# Patient Record
Sex: Male | Born: 1975 | Race: White | Hispanic: No | Marital: Married | Smoking: Former smoker
Health system: Southern US, Community
[De-identification: ages and names within clinical notes are randomized; demographics above are authoritative.]

## PROBLEM LIST (undated history)

## (undated) HISTORY — PX: APPENDECTOMY: SHX54

---

## 2001-09-03 ENCOUNTER — Emergency Department (HOSPITAL_COMMUNITY): Admission: EM | Admit: 2001-09-03 | Discharge: 2001-09-03 | Payer: Self-pay | Admitting: Emergency Medicine

## 2001-09-03 ENCOUNTER — Encounter: Payer: Self-pay | Admitting: Emergency Medicine

## 2004-02-02 ENCOUNTER — Emergency Department (HOSPITAL_COMMUNITY): Admission: EM | Admit: 2004-02-02 | Discharge: 2004-02-03 | Payer: Self-pay | Admitting: *Deleted

## 2005-01-21 ENCOUNTER — Encounter: Admission: RE | Admit: 2005-01-21 | Discharge: 2005-01-21 | Payer: Self-pay | Admitting: Occupational Medicine

## 2007-01-31 ENCOUNTER — Ambulatory Visit (HOSPITAL_COMMUNITY): Admission: RE | Admit: 2007-01-31 | Discharge: 2007-01-31 | Payer: Self-pay | Admitting: Family Medicine

## 2010-10-11 ENCOUNTER — Other Ambulatory Visit (HOSPITAL_COMMUNITY): Payer: Self-pay | Admitting: Internal Medicine

## 2010-10-11 DIAGNOSIS — M25512 Pain in left shoulder: Secondary | ICD-10-CM

## 2010-10-13 ENCOUNTER — Ambulatory Visit (HOSPITAL_COMMUNITY)
Admission: RE | Admit: 2010-10-13 | Discharge: 2010-10-13 | Disposition: A | Discharge status: Home / Self Care | Payer: BC Managed Care – PPO | Source: Ambulatory Visit | Attending: Internal Medicine | Admitting: Internal Medicine

## 2010-10-13 DIAGNOSIS — M25512 Pain in left shoulder: Secondary | ICD-10-CM

## 2010-10-13 DIAGNOSIS — M19019 Primary osteoarthritis, unspecified shoulder: Secondary | ICD-10-CM | POA: Insufficient documentation

## 2010-10-13 DIAGNOSIS — M25519 Pain in unspecified shoulder: Secondary | ICD-10-CM | POA: Insufficient documentation

## 2012-01-29 ENCOUNTER — Emergency Department (HOSPITAL_COMMUNITY)
Admission: EM | Admit: 2012-01-29 | Discharge: 2012-01-29 | Disposition: A | Discharge status: Home / Self Care | Payer: BC Managed Care – PPO | Attending: Emergency Medicine | Admitting: Emergency Medicine

## 2012-01-29 ENCOUNTER — Encounter (HOSPITAL_COMMUNITY): Payer: Self-pay | Admitting: Emergency Medicine

## 2012-01-29 DIAGNOSIS — F17200 Nicotine dependence, unspecified, uncomplicated: Secondary | ICD-10-CM | POA: Insufficient documentation

## 2012-01-29 DIAGNOSIS — Z880 Allergy status to penicillin: Secondary | ICD-10-CM | POA: Insufficient documentation

## 2012-01-29 DIAGNOSIS — K047 Periapical abscess without sinus: Secondary | ICD-10-CM

## 2012-01-29 MED ORDER — HYDROCODONE-ACETAMINOPHEN 5-325 MG PO TABS
1.0000 | ORAL_TABLET | Freq: Once | ORAL | Status: DC
  Filled 2012-01-29: qty 1

## 2012-01-29 MED ORDER — CLINDAMYCIN HCL 150 MG PO CAPS: 300.0000 mg | ORAL_CAPSULE | Freq: Four times a day (QID) | ORAL | Status: AC

## 2012-01-29 MED ORDER — CLINDAMYCIN HCL 150 MG PO CAPS
300.0000 mg | ORAL_CAPSULE | Freq: Once | ORAL | Status: AC
  Administered 2012-01-29: 300 mg via ORAL
  Filled 2012-01-29: qty 2

## 2012-01-29 MED ORDER — IBUPROFEN 800 MG PO TABS: 800.0000 mg | ORAL_TABLET | Freq: Three times a day (TID) | ORAL | Status: AC

## 2012-01-29 MED ORDER — HYDROMORPHONE HCL PF 2 MG/ML IJ SOLN
2.0000 mg | Freq: Once | INTRAMUSCULAR | Status: AC
  Administered 2012-01-29: 2 mg via INTRAMUSCULAR
  Filled 2012-01-29: qty 1

## 2012-01-29 MED ORDER — OXYCODONE-ACETAMINOPHEN 5-325 MG PO TABS: 1.0000 | ORAL_TABLET | Freq: Four times a day (QID) | ORAL | Status: AC | PRN

## 2012-01-29 NOTE — ED Notes (Signed)
Pt c/o facial swelling and jaw pain since Friday. Pt has rt sided facial/lip/neck swelling.

## 2012-01-29 NOTE — ED Provider Notes (Signed)
History  This chart was scribed for Samuel Jakes, MD by Bennett Scrape. This patient was seen in room [facility] and the patient's care was started at 1:16PM.  CSN: [id number]  Arrival date & time 01/29/12  1226   First MD Initiated Contact with Patient 01/29/12 1316      Chief Complaint  Patient presents with  . Facial Swelling  . Dental Pain    Patient is a 36 y.o. male presenting with tooth pain. The history is provided by the patient. No language interpreter was used.  Dental PainThe primary symptoms include headaches. Primary symptoms do not include fever, shortness of breath or sore throat. The symptoms began 3 to 5 days ago. The symptoms are worsening. The symptoms are new. The symptoms occur constantly.  Additional symptoms include: facial swelling.    [name] is a 36 y.o. male who presents to the Emergency Department complaining of 3 days of gradual onset, gradually worsening, constant right-sided dental pain described as throbbing with associated facial swelling, right-sided lower jaw pain, right-sided neck pain and HA. He denies having pain with eating. He reports taking Ibuprofen with mild improvement. He denies fever, trouble swallowing, trouble breathing, nausea and emesis as associated symptoms. He does not have a h/o chronic medical conditions. He is a current everyday smoker and occasional alcohol user.   History reviewed. No pertinent past medical history.  Past Surgical History  Procedure Date  . Appendectomy     No family history on file.  History  Substance Use Topics  . Smoking status: Current Everyday Smoker    Types: Cigarettes  . Smokeless tobacco: Not on file  . Alcohol Use: Yes      Review of Systems  Constitutional: Negative for fever and chills.  HENT: Positive for facial swelling, neck pain and dental problem. Negative for sore throat.   Eyes: Negative for visual disturbance.  Respiratory: Negative for shortness of breath.     Cardiovascular: Negative for chest pain.  Gastrointestinal: Negative for nausea, vomiting, abdominal pain and diarrhea.  Genitourinary: Negative for dysuria.  Musculoskeletal: Negative for back pain.  Skin: Negative for rash.  Neurological: Positive for headaches.    Allergies  Amoxicillin and Penicillins  Home Medications   Current Outpatient Rx  Name Route Sig Dispense Refill  . CLINDAMYCIN HCL 150 MG PO CAPS Oral Take 2 capsules (300 mg total) by mouth every 6 (six) hours. 28 capsule 0  . IBUPROFEN 800 MG PO TABS Oral Take 1 tablet (800 mg total) by mouth 3 (three) times daily. 30 tablet 0  . OXYCODONE-ACETAMINOPHEN 5-325 MG PO TABS Oral Take 1-2 tablets by mouth every 6 (six) hours as needed for pain. 20 tablet 0    Triage Vitals: BP 137/87  Pulse 79  Temp 98.8 F (37.1 C) (Oral)  Resp 18  Ht 5\' 10"  (1.778 m)  Wt 210 lb (95.255 kg)  BMI 30.13 kg/m2  SpO2 98%  Physical Exam  Nursing note and vitals reviewed. Constitutional: He is oriented to person, place, and time. He appears well-developed and well-nourished. No distress.  HENT:  Head: Normocephalic and atraumatic.       3 cm right-sided facial swelling along the jaw line with induration, no buccal swelling, 2nd molar on the right side is avulsed with surrounding erythema and inflammation  Eyes: Conjunctivae and EOM are normal.  Neck: Neck supple. No tracheal deviation present.  Cardiovascular: Normal rate and regular rhythm.   No murmur heard. Pulmonary/Chest: Effort normal and  breath sounds normal. No respiratory distress.  Abdominal: Soft. Bowel sounds are normal. There is no tenderness.  Musculoskeletal: Normal range of motion. He exhibits no edema.  Lymphadenopathy:    He has cervical adenopathy (right-sided, no left sided adenopathy noted).  Neurological: He is alert and oriented to person, place, and time.  Skin: Skin is warm and dry.  Psychiatric: He has a normal mood and affect. His behavior is normal.     ED Course  Procedures (including critical care time)  DIAGNOSTIC STUDIES: Oxygen Saturation is 98% on room air, normal by my interpretation.    COORDINATION OF CARE: 1:40PM-Discussed treatment plan which includes Vicodin and Clindamycin with pt at bedside and pt agreed to plan. 1:50PM-Per nurse, pt is requesting a different pain medication, because he reports that he took Vicodin at home with no improvement. He also reported to the nurse that he took some of his wife's old antibiotic prescription. None of this was stated at my bedside interview with the pt. Will order Dilaudid instead. 2:12PM-Discussed discharge plan of motrin, clindamycin and percocet with pt and pt agreed to plan. Advised pt to follow up with a dentist and pt agreed.  Labs Reviewed - No data to display No results found.   1. Tooth abscess       MDM  Findings consistent with right-sided lower tooth molar tooth abscess no swelling to the floor the mouth. Patient is allergic to penicillin so he be treated with clindamycin first dose given in the emergency department and he also be treated with Percocet and Motrin patient does have a dentist that he will call Monday for followup. Patient knows to return for any swelling of the floor the mouth or pushing up of the tongue.      I personally performed the services described in this documentation, which was scribed in my presence. The recorded information has been reviewed and considered.     Samuel Jakes, MD 01/29/12 [phone]

## 2012-01-29 NOTE — ED Notes (Signed)
Iv removed from right forearm, cath intact, pt tolerated well,

## 2012-10-10 ENCOUNTER — Other Ambulatory Visit: Payer: Self-pay | Admitting: Occupational Medicine

## 2012-10-10 ENCOUNTER — Ambulatory Visit: Payer: Self-pay

## 2012-10-10 DIAGNOSIS — R52 Pain, unspecified: Secondary | ICD-10-CM

## 2015-05-05 ENCOUNTER — Other Ambulatory Visit (HOSPITAL_COMMUNITY): Payer: Self-pay | Admitting: Internal Medicine

## 2015-05-05 DIAGNOSIS — M5481 Occipital neuralgia: Secondary | ICD-10-CM

## 2015-05-06 ENCOUNTER — Emergency Department (HOSPITAL_COMMUNITY)
Admission: EM | Admit: 2015-05-06 | Discharge: 2015-05-06 | Disposition: A | Discharge status: Home / Self Care | Payer: 59 | Attending: Emergency Medicine | Admitting: Emergency Medicine

## 2015-05-06 ENCOUNTER — Emergency Department (HOSPITAL_COMMUNITY): Payer: 59

## 2015-05-06 DIAGNOSIS — F17210 Nicotine dependence, cigarettes, uncomplicated: Secondary | ICD-10-CM | POA: Diagnosis not present

## 2015-05-06 DIAGNOSIS — Z880 Allergy status to penicillin: Secondary | ICD-10-CM | POA: Insufficient documentation

## 2015-05-06 DIAGNOSIS — R519 Headache, unspecified: Secondary | ICD-10-CM

## 2015-05-06 DIAGNOSIS — R51 Headache: Secondary | ICD-10-CM

## 2015-05-06 DIAGNOSIS — M5481 Occipital neuralgia: Secondary | ICD-10-CM | POA: Insufficient documentation

## 2015-05-06 MED ORDER — BUPIVACAINE HCL (PF) 0.5 % IJ SOLN
INTRAMUSCULAR | Status: AC
  Filled 2015-05-06: qty 30

## 2015-05-06 MED ORDER — HYDROCODONE-ACETAMINOPHEN 5-325 MG PO TABS: 2.0000 | ORAL_TABLET | ORAL | Status: DC | PRN

## 2015-05-06 MED ORDER — METHOCARBAMOL 500 MG PO TABS: 500.0000 mg | ORAL_TABLET | Freq: Three times a day (TID) | ORAL | Status: DC | PRN

## 2015-05-06 MED ORDER — BUPIVACAINE HCL (PF) 0.5 % IJ SOLN
10.0000 mL | Freq: Once | INTRAMUSCULAR | Status: AC
  Administered 2015-05-06: 10 mL

## 2015-05-06 MED ORDER — PREDNISONE 20 MG PO TABS: 20.0000 mg | ORAL_TABLET | Freq: Two times a day (BID) | ORAL | Status: DC

## 2015-05-06 NOTE — ED Provider Notes (Signed)
CSN: [id number]     Arrival date & time 05/06/15  1802 History   First MD Initiated Contact with Patient 05/06/15 1815     Chief Complaint  Patient presents with  . Headache     HPI  Patient since for evaluation of a headache. Had a headache for the last 10 days. He awakened on a Sunday morning with pain "in my skull". He shows an area just inferior to the occipital ridge to the right superior aspect of the right neck. States it hurts to lean back or leaning back into the left. He does not feel pain in the anterior neck or left side of the neck. No radiation down the arm. No stroke symptoms numbness or weakness. No fall no injury or trauma.  His not have migraine symptoms. No nausea, no photophobia, no phonophobia, no fevers chills. No other abnormality's.  Seen by his primary care physician prescribed ibuprofen. Was given a shot of a pain medication and a dose of by mouth steroids. He states this helped for less than a few hours. This was 7 days ago.  No past medical history on file. Past Surgical History  Procedure Laterality Date  . Appendectomy     No family history on file. Social History  Substance Use Topics  . Smoking status: Current Every Day Smoker    Types: Cigarettes  . Smokeless tobacco: Not on file  . Alcohol Use: Yes    Review of Systems  Constitutional: Negative for fever, chills, diaphoresis, appetite change and fatigue.  HENT: Negative for mouth sores, sore throat and trouble swallowing.   Eyes: Negative for visual disturbance.  Respiratory: Negative for cough, chest tightness, shortness of breath and wheezing.   Cardiovascular: Negative for chest pain.  Gastrointestinal: Negative for nausea, vomiting, abdominal pain, diarrhea and abdominal distention.  Endocrine: Negative for polydipsia, polyphagia and polyuria.  Genitourinary: Negative for dysuria, frequency and hematuria.  Musculoskeletal: Negative for gait problem.  Skin: Negative for color change,  pallor and rash.  Neurological: Positive for headaches. Negative for dizziness, syncope and light-headedness.  Hematological: Does not bruise/bleed easily.  Psychiatric/Behavioral: Negative for behavioral problems and confusion.      Allergies  Amoxicillin and Penicillins  Home Medications   Prior to Admission medications   Medication Sig Start Date End Date Taking? Authorizing Provider  HYDROcodone-acetaminophen (NORCO/VICODIN) 5-325 MG tablet Take 2 tablets by mouth every 4 (four) hours as needed. 05/06/15   Rolland PorterMark Kaiea Esselman, MD  methocarbamol (ROBAXIN) 500 MG tablet Take 1 tablet (500 mg total) by mouth 3 (three) times daily between meals as needed. 05/06/15   Rolland PorterMark Bessye Stith, MD  predniSONE (DELTASONE) 20 MG tablet Take 1 tablet (20 mg total) by mouth 2 (two) times daily with a meal. 05/06/15   Rolland PorterMark Braelynne Garinger, MD   BP 123/87 mmHg  Pulse 62  Temp(Src) 97.9 F (36.6 C) (Oral)  Resp 20  Ht 5\' 10"  (1.778 m)  Wt 230 lb (104.327 kg)  BMI 33.00 kg/m2  SpO2 99% Physical Exam  Constitutional: He is oriented to person, place, and time. He appears well-developed and well-nourished. No distress.  HENT:  Head: Normocephalic.    Point tender in the direct suboccipital region and the right posterior to the mastoid.  Increase with movement of the neck into extension or rotation. No meningismus.  Eyes: Conjunctivae are normal. Pupils are equal, round, and reactive to light. No scleral icterus.  Neck: Normal range of motion. Neck supple. No thyromegaly present.  Cardiovascular: Normal rate and  regular rhythm.  Exam reveals no gallop and no friction rub.   No murmur heard. Pulmonary/Chest: Effort normal and breath sounds normal. No respiratory distress. He has no wheezes. He has no rales.  Abdominal: Soft. Bowel sounds are normal. He exhibits no distension. There is no tenderness. There is no rebound.  Musculoskeletal: Normal range of motion.  Neurological: He is alert and oriented to person, place, and  time.  Skin: Skin is warm and dry. No rash noted.  Psychiatric: He has a normal mood and affect. His behavior is normal.    ED Course  Procedures (including critical care time) Labs Review Labs Reviewed - No data to display  Imaging Review Ct Head Wo Contrast  05/06/2015  CLINICAL DATA:  Patient c/o pain to RT side of post head at base of skull since 04/26/15; patient states he has not had any kind of injury. EXAM: CT HEAD WITHOUT CONTRAST CT CERVICAL SPINE WITHOUT CONTRAST TECHNIQUE: Multidetector CT imaging of the head and cervical spine was performed following the standard protocol without intravenous contrast. Multiplanar CT image reconstructions of the cervical spine were also generated. COMPARISON:  None. FINDINGS: CT HEAD FINDINGS There is no evidence of acute intracranial hemorrhage, brain edema, mass lesion, acute infarction, mass effect, or midline shift. Acute infarct may be inapparent on noncontrast CT. No other intra-axial abnormalities are seen, and the ventricles and sulci are within normal limits in size and symmetry. No abnormal extra-axial fluid collections or masses are identified. No significant calvarial abnormality. CT CERVICAL SPINE FINDINGS Normal alignment. Vertebral body and disc height maintained throughout. No prevertebral soft tissue swelling. Facets are seated. Negative for fracture. No significant osseous degenerative change. Lung apices clear. IMPRESSION: 1. Negative for bleed or other acute intracranial process. 2. Negative cervical spine. Electronically Signed   By: Corlis Leak M.D.   On: 05/06/2015 19:17   Ct Cervical Spine Wo Contrast  05/06/2015  CLINICAL DATA:  Patient c/o pain to RT side of post head at base of skull since 04/26/15; patient states he has not had any kind of injury. EXAM: CT HEAD WITHOUT CONTRAST CT CERVICAL SPINE WITHOUT CONTRAST TECHNIQUE: Multidetector CT imaging of the head and cervical spine was performed following the standard protocol  without intravenous contrast. Multiplanar CT image reconstructions of the cervical spine were also generated. COMPARISON:  None. FINDINGS: CT HEAD FINDINGS There is no evidence of acute intracranial hemorrhage, brain edema, mass lesion, acute infarction, mass effect, or midline shift. Acute infarct may be inapparent on noncontrast CT. No other intra-axial abnormalities are seen, and the ventricles and sulci are within normal limits in size and symmetry. No abnormal extra-axial fluid collections or masses are identified. No significant calvarial abnormality. CT CERVICAL SPINE FINDINGS Normal alignment. Vertebral body and disc height maintained throughout. No prevertebral soft tissue swelling. Facets are seated. Negative for fracture. No significant osseous degenerative change. Lung apices clear. IMPRESSION: 1. Negative for bleed or other acute intracranial process. 2. Negative cervical spine. Electronically Signed   By: Corlis Leak M.D.   On: 05/06/2015 19:17   I have personally reviewed and evaluated these images and lab results as part of my medical decision-making.   EKG Interpretation None      MDM   Final diagnoses:  Acute nonintractable headache, unspecified headache type  Occipital neuralgia of right side    Patient given suboccipital nerve block. As performed by me. After Betadine prep, 5 mL of 0.5% Marcaine injected along the occipital ridge to the right  suboccipital nerve. This was without complication.  CT scans were obtained of his head and his neck showed no acute abnormality. On recheck he has complete resolution of his pain. I think his symptoms were likely secondary to suboccipital muscle pain, or occipital neuralgia. Discharge with pain medication, muscle action, anti-inflammatory. Also prednisone. Asked to follow up with his primary care physician.        Rolland Porter, MD 05/06/15 [phone]

## 2015-05-06 NOTE — ED Notes (Signed)
Pain is to right side of base of skull.  Pt is having issues with getting approved for CT vs MRI per pt.

## 2015-05-06 NOTE — ED Notes (Signed)
Pt states understanding of care given and follow up instructions 

## 2015-05-06 NOTE — Discharge Instructions (Signed)
Occipital Neuralgia Occipital neuralgia is a type of headache that causes episodes of very bad pain in the back of your head. Pain from occipital neuralgia may spread (radiate) to other parts of your head. The pain is usually brief and often goes away after you rest and relax. These headaches may be caused by irritation of the nerves that leave your spinal cord high up in your neck, just below the base of your skull (occipital nerves). Your occipital nerves transmit sensations from the back of your head, the top of your head, and the areas behind your ears. CAUSES Occipital neuralgia can occur without any known cause (primary headache syndrome). In other cases, occipital neuralgia is caused by pressure on or irritation of one of the two occipital nerves. Causes of occipital nerve compression or irritation include:  Wear and tear of the vertebrae in the neck (osteoarthritis).  Neck injury.  Disease of the disks that separate the vertebrae.  Tumors.  Gout.  Infections.  Diabetes.  Swollen blood vessels that put pressure on the occipital nerves.  Muscle spasm in the neck. SIGNS AND SYMPTOMS Pain is the main symptom of occipital neuralgia. It usually starts in the back of the head but may also be felt in other areas supplied by the occipital nerves. Pain is usually on one side but may be on both sides. You may have:   Brief episodes of very bad pain that is burning, stabbing, shocking, or shooting.  Pain behind the eye.  Pain triggered by neck movement or hair brushing.  Scalp tenderness.  Aching in the back of the head between episodes of very bad pain. DIAGNOSIS  Your health care provider may diagnose occipital neuralgia based on your symptoms and a physical exam. During the exam, the health care provider may push on areas supplied by the occipital nerves to see if they are painful. Some tests may also be done to help in making the diagnosis. These may include:  Imaging studies of  the upper spinal cord, such as an MRI or CT scan. These may show compression or spinal cord abnormalities.  Nerve block. You will get an injection of numbing medicine (local anesthetic) near the occipital nerve to see if this relieves pain. TREATMENT  Treatment may begin with simple measures, such as:   Rest.  Massage.  Heat.  Over-the-counter pain relievers. If these measures do not work, you may need other treatments, including:  Medicines such as:  Prescription-strength anti-inflammatory medicines.  Muscle relaxants.  Antiseizure medicines.  Antidepressants.  Steroid injection. This involves injections of local anesthetic and strong anti-inflammatory drugs (steroids).  Pulsed radiofrequency. Wires are implanted to deliver electrical impulses that block pain signals from the occipital nerve.  Physical therapy.  Surgery to relieve nerve pressure. HOME CARE INSTRUCTIONS  Take all medicines as directed by your health care provider.  Avoid activities that cause pain.  Rest when you have an attack of pain.  Try gentle massage or a heating pad to relieve pain.  Work with a physical therapist to learn stretching exercises you can do at home.  Try a different pillow or sleeping position.  Practice good posture.  Try to stay active. Get regular exercise that does not cause pain. Ask your health care provider to suggest safe exercises for you.  Keep all follow-up visits as directed by your health care provider. This is important. SEEK MEDICAL CARE IF:  Your medicine is not working.  You have new or worsening symptoms. SEEK IMMEDIATE MEDICAL CARE   IF:  You have very bad head pain that is not going away.  You have a sudden change in vision, balance, or speech. MAKE SURE YOU:  Understand these instructions.  Will watch your condition.  Will get help right away if you are not doing well or get worse.   This information is not intended to replace advice given to  you by your health care provider. Make sure you discuss any questions you have with your health care provider.   Document Released: 05/31/2001 Document Revised: 06/27/2014 Document Reviewed: 05/29/2013 Elsevier Interactive Patient Education 2016 [employer]  

## 2015-05-06 NOTE — ED Notes (Signed)
Having headache since 04/26/15, seen PCP on Wed and treated.  Rates pain 8/10.

## 2016-09-22 IMAGING — CT CT CERVICAL SPINE W/O CM
3 of 6 series · 11 of 33 positions shown, 13 images · non-contrast
Comparison: None.

CLINICAL DATA: Patient c/o pain to RT side of post head at base of
skull since 04/26/15; patient states he has not had any kind of
injury.

EXAM:
CT HEAD WITHOUT CONTRAST
CT CERVICAL SPINE WITHOUT CONTRAST
TECHNIQUE: Multidetector CT imaging of the head and cervical spine was
performed following the standard protocol without intravenous
contrast. Multiplanar CT image reconstructions of the cervical spine
were also generated.

[Series 7: sagittal bone 2.0 · sagittal · 0.21mm/px · 5 of 43 slices shown, 6 images]
[im 15/43  bone]
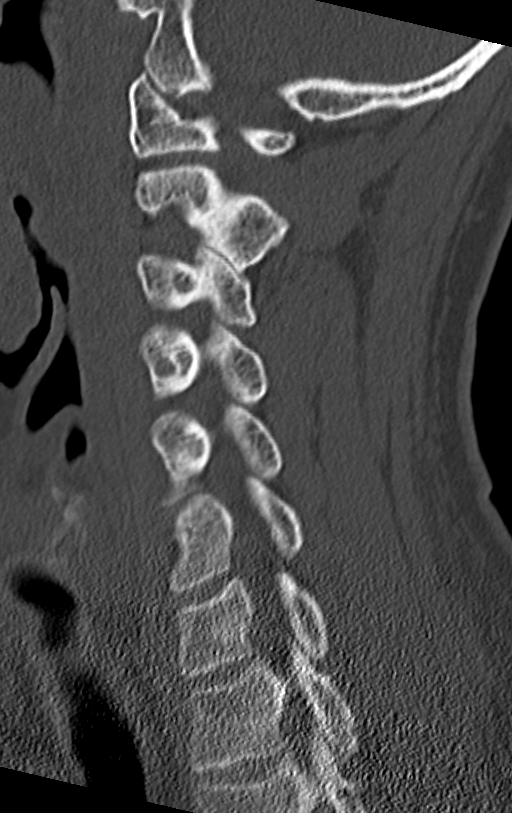
[im 18/43  bone]
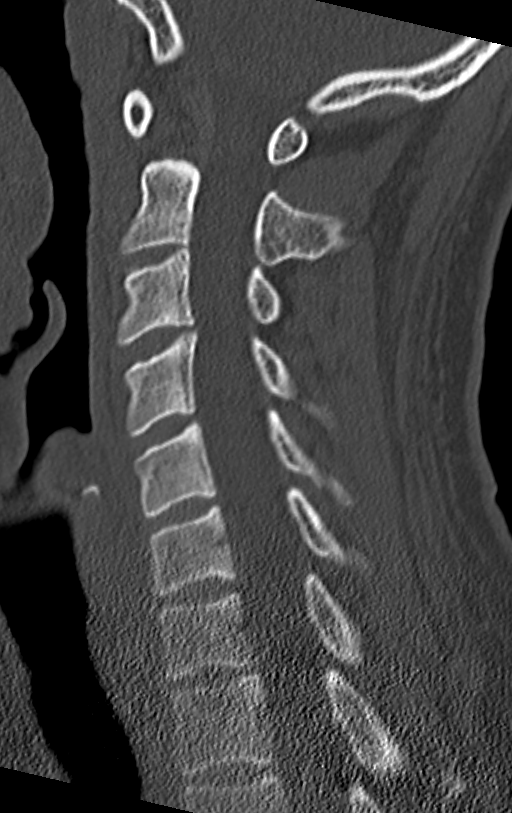
[im 22/43  soft-tissue]
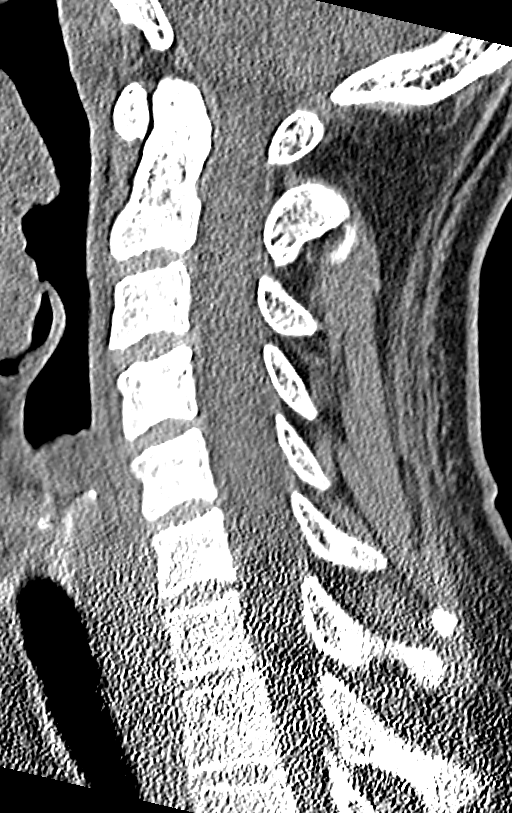
[im 22/43  bone]
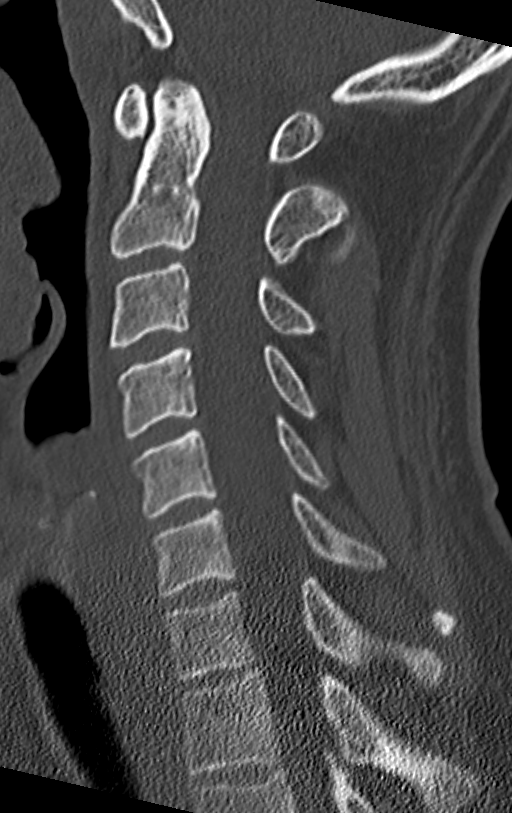
[im 25/43  bone]
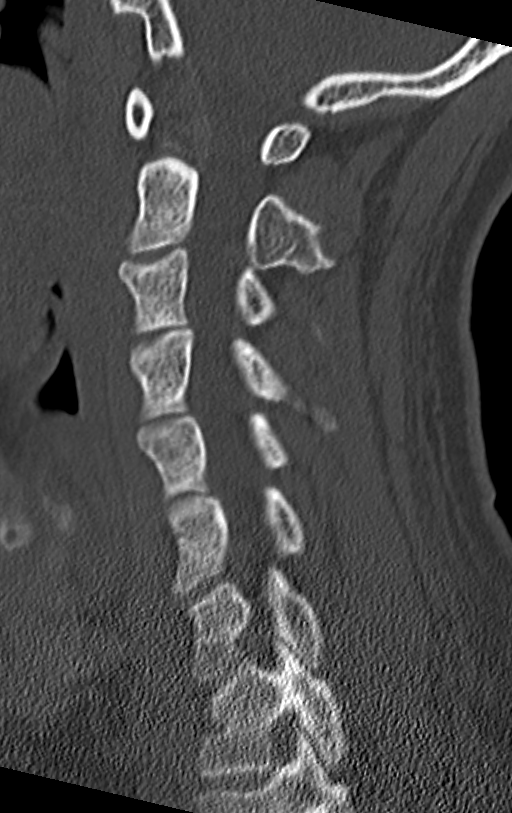
[im 29/43  bone]
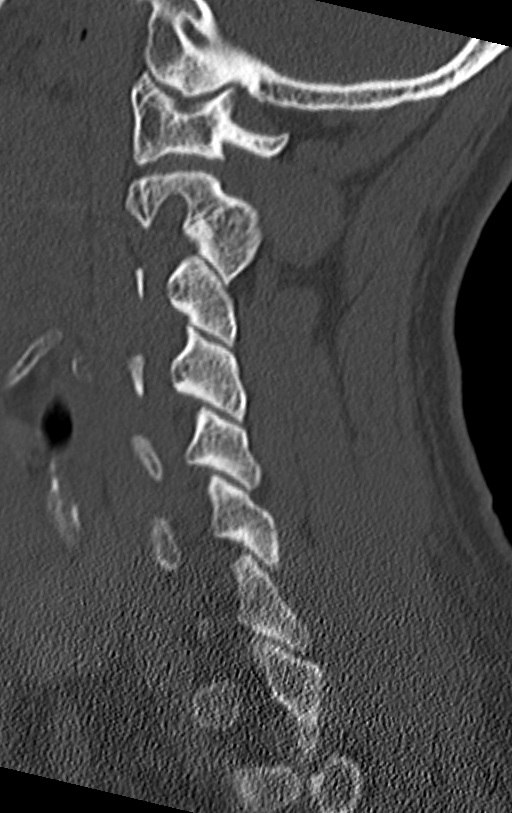

[Series 8: coronal bone 2.0 · coronal · 0.20mm/px · 3 of 53 slices shown]
[im 11/53  bone]
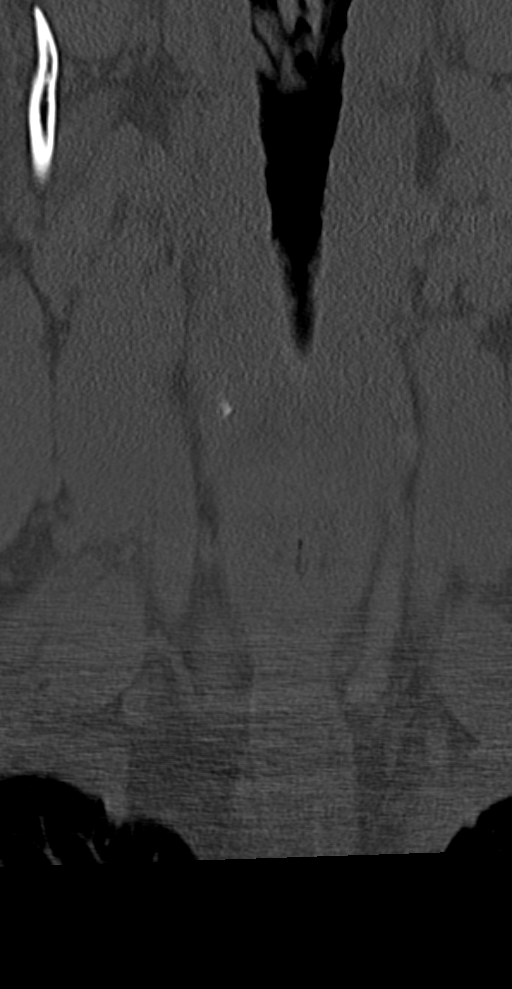
[im 21/53  bone]
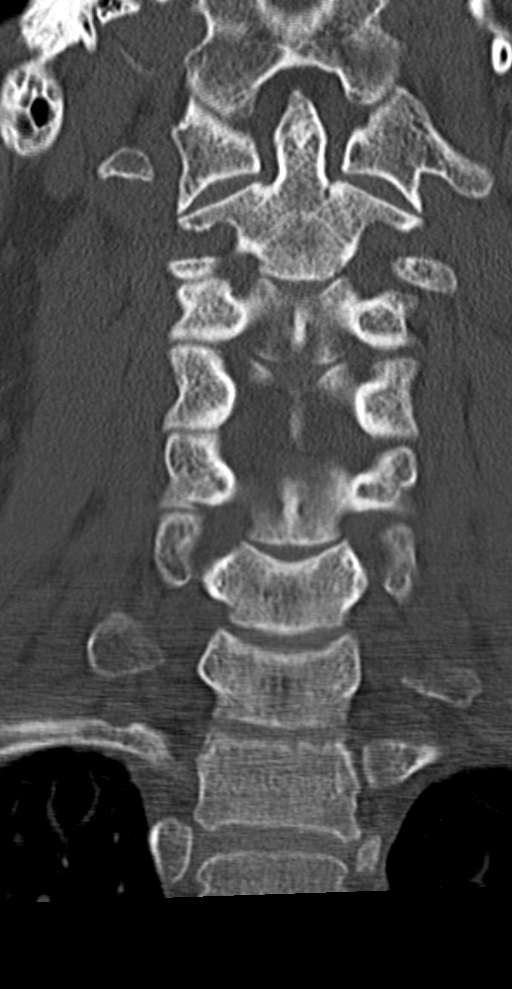
[im 32/53  bone]
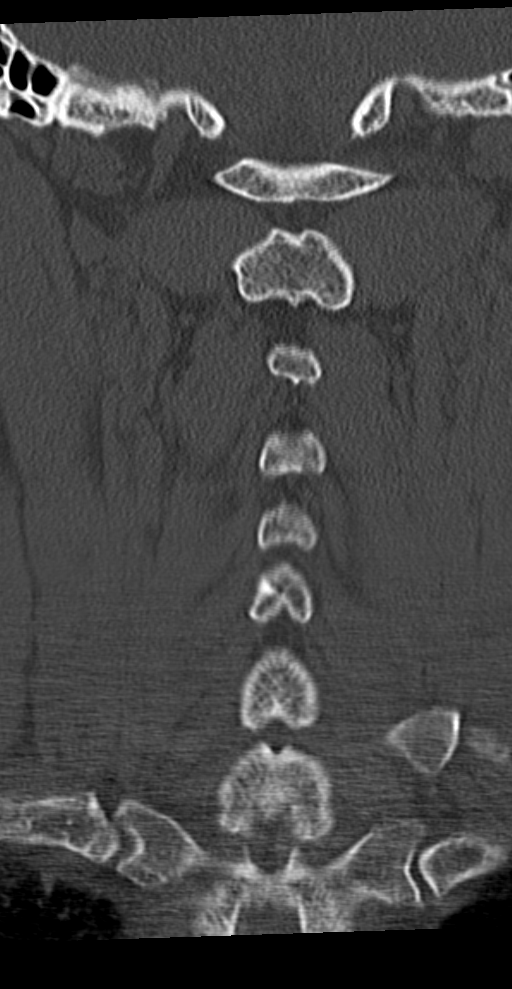

[Series 9: axial bone 2.0 · axial · 0.21mm/px · z∈[+104,+190]mm · 3 of 92 slices shown, 4 images]
[im 23/92  soft-tissue]
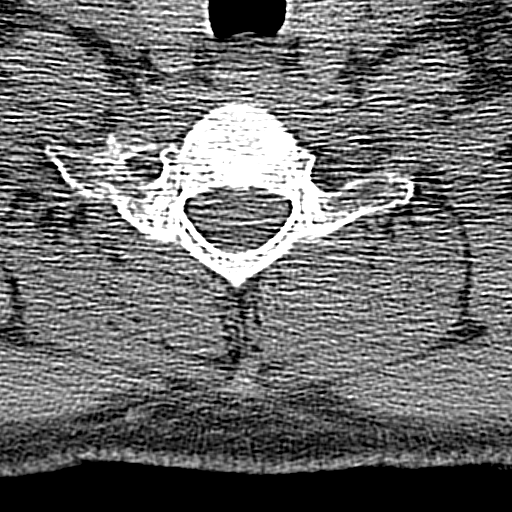
[im 23/92  bone]
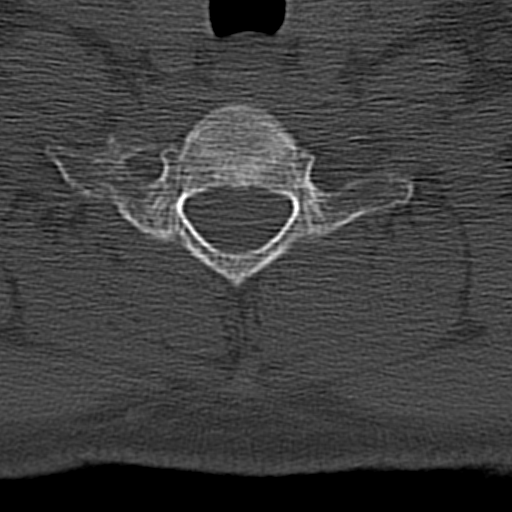
[im 46/92  bone]
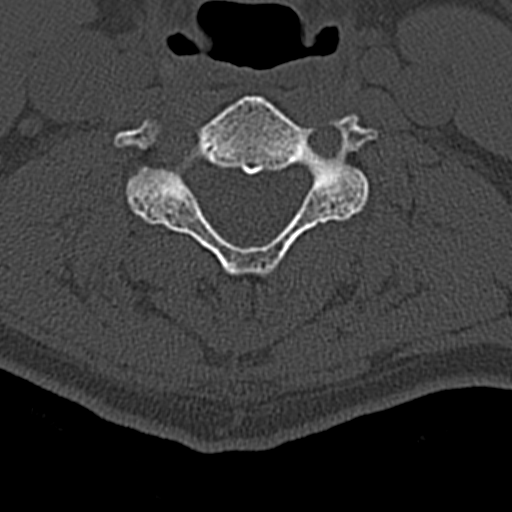
[im 69/92  bone]
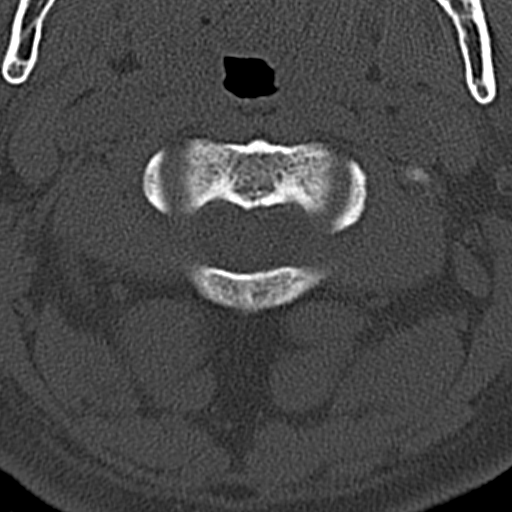

[11 of 33 positions shown; findings below may reference images not displayed]

FINDINGS: CT HEAD FINDINGS

There is no evidence of acute intracranial hemorrhage, brain edema,
mass lesion, acute infarction, mass effect, or midline shift. Acute
infarct may be inapparent on noncontrast CT. No other intra-axial
abnormalities are seen, and the ventricles and sulci are within
normal limits in size and symmetry. No abnormal extra-axial fluid
collections or masses are identified. No significant calvarial
abnormality.

CT CERVICAL SPINE FINDINGS

Normal alignment. Vertebral body and disc height maintained
throughout. No prevertebral soft tissue swelling. Facets are seated.
Negative for fracture. No significant osseous degenerative change.
Lung apices clear.
IMPRESSION: 1. Negative for bleed or other acute intracranial process.
2. Negative cervical spine.

## 2017-05-23 ENCOUNTER — Emergency Department (HOSPITAL_COMMUNITY): Payer: 59

## 2017-05-23 ENCOUNTER — Other Ambulatory Visit: Payer: Self-pay

## 2017-05-23 ENCOUNTER — Encounter (HOSPITAL_COMMUNITY): Payer: Self-pay | Admitting: *Deleted

## 2017-05-23 ENCOUNTER — Emergency Department (HOSPITAL_COMMUNITY)
Admission: EM | Admit: 2017-05-23 | Discharge: 2017-05-23 | Disposition: A | Discharge status: Home / Self Care | Payer: 59 | Attending: Emergency Medicine | Admitting: Emergency Medicine

## 2017-05-23 DIAGNOSIS — R0789 Other chest pain: Secondary | ICD-10-CM | POA: Diagnosis not present

## 2017-05-23 DIAGNOSIS — R079 Chest pain, unspecified: Secondary | ICD-10-CM | POA: Diagnosis present

## 2017-05-23 DIAGNOSIS — F17210 Nicotine dependence, cigarettes, uncomplicated: Secondary | ICD-10-CM | POA: Diagnosis not present

## 2017-05-23 DIAGNOSIS — Z79899 Other long term (current) drug therapy: Secondary | ICD-10-CM | POA: Diagnosis not present

## 2017-05-23 LAB — BASIC METABOLIC PANEL
ANION GAP: 8 (ref 5–15)
BUN: 10 mg/dL (ref 6–20)
CALCIUM: 9.7 mg/dL (ref 8.9–10.3)
CO2: 26 mmol/L (ref 22–32)
CREATININE: 0.86 mg/dL (ref 0.61–1.24)
Chloride: 103 mmol/L (ref 101–111)
GLUCOSE: 98 mg/dL (ref 65–99)
Potassium: 3.9 mmol/L (ref 3.5–5.1)
Sodium: 137 mmol/L (ref 135–145)

## 2017-05-23 LAB — CBC WITH DIFFERENTIAL/PLATELET
BASOS ABS: 0 10*3/uL (ref 0.0–0.1)
BASOS PCT: 0 %
EOS ABS: 0.2 10*3/uL (ref 0.0–0.7)
EOS PCT: 1 %
HEMATOCRIT: 43.7 % (ref 39.0–52.0)
HEMOGLOBIN: 14.5 g/dL (ref 13.0–17.0)
LYMPHS ABS: 2.1 10*3/uL (ref 0.7–4.0)
Lymphocytes Relative: 16 %
MCH: 29.5 pg (ref 26.0–34.0)
MCHC: 33.2 g/dL (ref 30.0–36.0)
MCV: 89 fL (ref 78.0–100.0)
MONOS PCT: 12 %
Monocytes Absolute: 1.5 10*3/uL — ABNORMAL HIGH (ref 0.1–1.0)
NEUTROS PCT: 71 %
Neutro Abs: 9.3 10*3/uL — ABNORMAL HIGH (ref 1.7–7.7)
Platelets: 226 10*3/uL (ref 150–400)
RBC: 4.91 MIL/uL (ref 4.22–5.81)
RDW: 12.9 % (ref 11.5–15.5)
WBC: 13.1 10*3/uL — AB (ref 4.0–10.5)

## 2017-05-23 LAB — D-DIMER, QUANTITATIVE: D-Dimer, Quant: <0.27 ug/mL-FEU (ref 0.00–0.50)

## 2017-05-23 LAB — TROPONIN I: Troponin I: <0.03 ng/mL (ref <0.03)

## 2017-05-23 MED ORDER — MORPHINE SULFATE (PF) 4 MG/ML IV SOLN: 4.0000 mg | INTRAVENOUS | Status: DC | PRN

## 2017-05-23 MED ORDER — NAPROXEN 250 MG PO TABS: 250.0000 mg | ORAL_TABLET | Freq: Two times a day (BID) | ORAL | 0 refills | Status: DC | PRN

## 2017-05-23 MED ORDER — METHOCARBAMOL 500 MG PO TABS: 1000.0000 mg | ORAL_TABLET | Freq: Four times a day (QID) | ORAL | 0 refills | Status: DC | PRN

## 2017-05-23 NOTE — ED Provider Notes (Signed)
[facility] Provider Note   CSN: [id number] Arrival date & time: 05/23/17  1801     History   Chief Complaint Chief Complaint  Patient presents with  . Chest Pain    HPI Samuel Daniels is a 41 y.o. male.  HPI  Pt was seen at 1850. Per pt, c/o gradual onset and persistence of constant chest "pain" for the past 2 days. Describes the CP as "constant" and "sharp" and "tight."  Pt states the CP started Sunday afternoon after he played golf in the morning. Pt did not have CP while playing golf. Pt states the pain started in his left chest, now is located in his left and right chest. Denies palpitations, no SOB/cough, no abd pain, no N/V/D, no back pain, no neck pain, no fevers, no rash.    History reviewed. No pertinent past medical history.  There are no active problems to display for this patient.   Past Surgical History:  Procedure Laterality Date  . APPENDECTOMY         Home Medications    Prior to Admission medications   Medication Sig Start Date End Date Taking? Authorizing Provider  HYDROcodone-acetaminophen (NORCO/VICODIN) 5-325 MG tablet Take 2 tablets by mouth every 4 (four) hours as needed. 05/06/15   Rolland PorterJames, Mark, MD  methocarbamol (ROBAXIN) 500 MG tablet Take 1 tablet (500 mg total) by mouth 3 (three) times daily between meals as needed. 05/06/15   Rolland PorterJames, Mark, MD  predniSONE (DELTASONE) 20 MG tablet Take 1 tablet (20 mg total) by mouth 2 (two) times daily with a meal. 05/06/15   Rolland PorterJames, Mark, MD    Family History Denies early CAD hx in family. No family history on file.    Social History Social History   Tobacco Use  . Smoking status: Current Every Day Smoker    Types: Cigarettes  . Smokeless tobacco: Never Used  Substance Use Topics  . Alcohol use: Yes  . Drug use: No     Allergies   Amoxicillin and Penicillins   Review of Systems Review of Systems ROS: Statement: All systems negative except as marked or noted in the  HPI; Constitutional: Negative for fever and chills. ; ; Eyes: Negative for eye pain, redness and discharge. ; ; ENMT: Negative for ear pain, hoarseness, nasal congestion, sinus pressure and sore throat. ; ; Cardiovascular: +CP. Negative for palpitations, diaphoresis, dyspnea and peripheral edema. ; ; Respiratory: Negative for cough, wheezing and stridor. ; ; Gastrointestinal: Negative for nausea, vomiting, diarrhea, abdominal pain, blood in stool, hematemesis, jaundice and rectal bleeding. . ; ; Genitourinary: Negative for dysuria, flank pain and hematuria. ; ; Musculoskeletal: Negative for back pain and neck pain. Negative for swelling and trauma.; ; Skin: Negative for pruritus, rash, abrasions, blisters, bruising and skin lesion.; ; Neuro: Negative for headache, lightheadedness and neck stiffness. Negative for weakness, altered level of consciousness, altered mental status, extremity weakness, paresthesias, involuntary movement, seizure and syncope.       Physical Exam Updated Vital Signs BP 135/87   Pulse 84   Resp 18   Ht 5\' 11"  (1.803 m)   Wt 101.6 kg (224 lb)   SpO2 98%   BMI 31.24 kg/m   Physical Exam 1855: Physical examination:  Nursing notes reviewed; Vital signs and O2 SAT reviewed;  Constitutional: Well developed, Well nourished, Well hydrated, In no acute distress; Head:  Normocephalic, atraumatic; Eyes: EOMI, PERRL, No scleral icterus; ENMT: Mouth and pharynx normal, Mucous membranes moist; Neck: Supple,  Full range of motion, No lymphadenopathy; Cardiovascular: Regular rate and rhythm, No gallop; Respiratory: Breath sounds clear & equal bilaterally, No wheezes.  Speaking full sentences with ease, Normal respiratory effort/excursion; Chest: Nontender, Movement normal; Abdomen: Soft, Nontender, Nondistended, Normal bowel sounds; Genitourinary: No CVA tenderness; Extremities: Pulses normal, No tenderness, No edema, No calf edema or asymmetry.; Neuro: AA&Ox3, Major CN grossly intact.   Speech clear. No gross focal motor or sensory deficits in extremities.; Skin: Color normal, Warm, Dry.   ED Treatments / Results  Labs (all labs ordered are listed, but only abnormal results are displayed)   EKG  EKG Interpretation  Date/Time:  Tuesday May 23 2017 18:18:42 EST Ventricular Rate:  80 PR Interval:  184 QRS Duration: 84 QT Interval:  352 QTC Calculation: 405 R Axis:   14 Text Interpretation:  Normal sinus rhythm Nonspecific T wave abnormality No old tracing to compare Confirmed by Samuel Jester [phone]) on 05/23/2017 6:57:47 PM       Radiology   Procedures Procedures (including critical care time)  Medications Ordered in ED Medications  morphine 4 MG/ML injection 4 mg (not administered)     Initial Impression / Assessment and Plan / ED Course  I have reviewed the triage vital signs and the nursing notes.  Pertinent labs & imaging results that were available during my care of the patient were reviewed by me and considered in my medical decision making (see chart for details).  MDM Reviewed: previous chart, nursing note and vitals Reviewed previous: labs Interpretation: labs, ECG and x-ray   Results for orders placed or performed during the hospital encounter of 05/23/17  Basic metabolic panel  Result Value Ref Range   Sodium 137 135 - 145 mmol/L   Potassium 3.9 3.5 - 5.1 mmol/L   Chloride 103 101 - 111 mmol/L   CO2 26 22 - 32 mmol/L   Glucose, Bld 98 65 - 99 mg/dL   BUN 10 6 - 20 mg/dL   Creatinine, Ser 6.04 0.61 - 1.24 mg/dL   Calcium 9.7 8.9 - 54.0 mg/dL   GFR calc non Af Amer >60 >60 mL/min   GFR calc Af Amer >60 >60 mL/min   Anion gap 8 5 - 15  Troponin I  Result Value Ref Range   Troponin I <0.03 <0.03 ng/mL  CBC with Differential  Result Value Ref Range   WBC 13.1 (H) 4.0 - 10.5 K/uL   RBC 4.91 4.22 - 5.81 MIL/uL   Hemoglobin 14.5 13.0 - 17.0 g/dL   HCT 98.1 19.1 - 47.8 %   MCV 89.0 78.0 - 100.0 fL   MCH 29.5 26.0 - 34.0  pg   MCHC 33.2 30.0 - 36.0 g/dL   RDW 29.5 62.1 - 30.8 %   Platelets 226 150 - 400 K/uL   Neutrophils Relative % 71 %   Neutro Abs 9.3 (H) 1.7 - 7.7 K/uL   Lymphocytes Relative 16 %   Lymphs Abs 2.1 0.7 - 4.0 K/uL   Monocytes Relative 12 %   Monocytes Absolute 1.5 (H) 0.1 - 1.0 K/uL   Eosinophils Relative 1 %   Eosinophils Absolute 0.2 0.0 - 0.7 K/uL   Basophils Relative 0 %   Basophils Absolute 0.0 0.0 - 0.1 K/uL  D-dimer, quantitative  Result Value Ref Range   D-Dimer, Quant <0.27 0.00 - 0.50 ug/mL-FEU   Dg Chest 2 View Result Date: 05/23/2017 CLINICAL DATA:  Left-sided chest pain for the past 2 days. EXAM: CHEST  2 VIEW COMPARISON:  Chest x-ray dated January 21, 2005. FINDINGS: The heart size and mediastinal contours are within normal limits. Both lungs are clear. The visualized skeletal structures are unremarkable. IMPRESSION: No active cardiopulmonary disease. Electronically Signed   By: Obie DredgeWilliam T Derry M.D.   On: 05/23/2017 20:33    2055:  Doubt PE as cause for symptoms with normal d-dimer and low risk [place].  Doubt ACS as cause for symptoms with normal troponin and non-specific EKG after 2 days of constant symptoms. Also reassured that pt did not have CP while playing golf. Tx symptomatically at this time. Dx and testing d/w pt and family.  Questions answered.  Verb understanding, agreeable to d/c home with outpt f/u.    Final Clinical Impressions(s) / ED Diagnoses   Final diagnoses:  None    ED Discharge Orders    None       Samuel JesterMcManus, Steffani Dionisio, DO 05/26/17 [id number]

## 2017-05-23 NOTE — ED Triage Notes (Signed)
Chest pain left side for 2 days

## 2017-05-23 NOTE — Discharge Instructions (Signed)
Take the prescriptions as directed.  Apply moist heat or ice to the area(s) of discomfort, for 15 minutes at a time, several times per day for the next few days.  Do not fall asleep on a heating or ice pack.  Call your regular medical doctor tomorrow to schedule a follow up appointment in the next 2 days.  Return to the Emergency Department immediately if worsening. ° °

## 2018-02-13 DIAGNOSIS — M25532 Pain in left wrist: Secondary | ICD-10-CM | POA: Diagnosis not present

## 2018-02-13 DIAGNOSIS — G9009 Other idiopathic peripheral autonomic neuropathy: Secondary | ICD-10-CM | POA: Diagnosis not present

## 2018-02-13 DIAGNOSIS — Z6833 Body mass index (BMI) 33.0-33.9, adult: Secondary | ICD-10-CM | POA: Diagnosis not present

## 2018-05-30 ENCOUNTER — Emergency Department (HOSPITAL_COMMUNITY)
Admission: EM | Admit: 2018-05-30 | Discharge: 2018-05-30 | Disposition: A | Discharge status: Home / Self Care | Payer: 59 | Attending: Emergency Medicine | Admitting: Emergency Medicine

## 2018-05-30 ENCOUNTER — Other Ambulatory Visit: Payer: Self-pay

## 2018-05-30 ENCOUNTER — Encounter (HOSPITAL_COMMUNITY): Payer: Self-pay | Admitting: Emergency Medicine

## 2018-05-30 DIAGNOSIS — Z87891 Personal history of nicotine dependence: Secondary | ICD-10-CM | POA: Insufficient documentation

## 2018-05-30 DIAGNOSIS — L0231 Cutaneous abscess of buttock: Secondary | ICD-10-CM | POA: Insufficient documentation

## 2018-05-30 MED ORDER — IBUPROFEN 800 MG PO TABS
800.0000 mg | ORAL_TABLET | Freq: Once | ORAL | Status: AC
  Administered 2018-05-30: 800 mg via ORAL
  Filled 2018-05-30: qty 1

## 2018-05-30 MED ORDER — SULFAMETHOXAZOLE-TRIMETHOPRIM 800-160 MG PO TABS: 1.0000 | ORAL_TABLET | Freq: Two times a day (BID) | ORAL | 0 refills | Status: AC

## 2018-05-30 MED ORDER — LIDOCAINE HCL (PF) 2 % IJ SOLN
INTRAMUSCULAR | Status: AC
  Administered 2018-05-30: 23:00:00
  Filled 2018-05-30: qty 20

## 2018-05-30 MED ORDER — SULFAMETHOXAZOLE-TRIMETHOPRIM 800-160 MG PO TABS
1.0000 | ORAL_TABLET | Freq: Once | ORAL | Status: AC
  Administered 2018-05-30: 1 via ORAL
  Filled 2018-05-30: qty 1

## 2018-05-30 NOTE — ED Provider Notes (Signed)
[facility]   CSN: [id number] Arrival date & time: 05/30/18  2204     History   Chief Complaint Chief Complaint  Patient presents with  . Abscess    HPI Samuel Daniels is a 42 y.o. male.   Abscess  Size:  Buttock Abscess quality: draining, induration, painful, redness and warmth   Red streaking: no   Duration:  3 days Progression:  Worsening Pain details:    Quality:  Pressure and aching   Severity:  Moderate   Duration:  3 days   Timing:  Constant   Progression:  Worsening Chronicity:  New Context: not diabetes and not immunosuppression   Relieved by:  Nothing Worsened by:  Draining/squeezing Ineffective treatments:  NSAIDs and draining/squeezing Associated symptoms: no anorexia, no fatigue, no fever, no headaches, no nausea and no vomiting   Risk factors: no hx of MRSA and no prior abscess     History reviewed. No pertinent past medical history.  There are no active problems to display for this patient.   Past Surgical History:  Procedure Laterality Date  . APPENDECTOMY          Home Medications    Prior to Admission medications   Medication Sig Start Date End Date Taking? Authorizing Provider  sulfamethoxazole-trimethoprim (BACTRIM DS,SEPTRA DS) 800-160 MG tablet Take 1 tablet by mouth 2 (two) times daily for 5 days. 05/30/18 06/04/18  Garnette Gunnerhompson, Eva Griffo B, MD    Family History No family history on file.  Social History Social History   Tobacco Use  . Smoking status: Former Smoker    Types: Cigarettes  . Smokeless tobacco: Never Used  Substance Use Topics  . Alcohol use: Yes  . Drug use: No     Allergies   Amoxicillin and Penicillins   Review of Systems Review of Systems  Constitutional: Negative for fatigue and fever.  Gastrointestinal: Negative for anorexia, nausea and vomiting.  Neurological: Negative for headaches.  All other systems reviewed and are negative.    Physical Exam Updated  Vital Signs BP (!) 141/78   Pulse 78   Temp 97.9 F (36.6 C)   Resp 20   Ht 5\' 10"  (1.778 m)   Wt 104.3 kg   SpO2 100%   BMI 33.00 kg/m   General well-appearing, no distressGeneral: Buttocks: Indurated, abscess, on medial inferior gluteal cheek, no streaking, tender to palpation, 2 inches in diameter   ED Treatments / Results  Labs (all labs ordered are listed, but only abnormal results are displayed) Labs Reviewed - No data to display  EKG None  Radiology No results found.  Procedures .Marland Kitchen.Incision and Drainage Date/Time: 05/30/2018 11:06 PM Performed by: Garnette Gunnerhompson, Jaqualin Serpa B, MD Authorized by: Jacalyn LefevreHaviland, Julie, MD   Consent:    Consent obtained:  Verbal   Consent given by:  Patient   Risks discussed:  Bleeding, incomplete drainage, pain and infection   Alternatives discussed:  No treatment Location:    Type:  Abscess   Location:  Anogenital   Anogenital location:  Gluteal cleft Pre-procedure details:    Skin preparation:  Betadine Anesthesia (see MAR for exact dosages):    Anesthesia method:  Local infiltration   Local anesthetic:  Lidocaine 2% w/o epi Procedure type:    Complexity:  Simple Procedure details:    Incision types:  Stab incision   Scalpel blade:  11   Wound management:  Irrigated with saline   Drainage:  Bloody and purulent   Drainage amount:  Moderate   Wound treatment:  Wound left open   Packing materials:  None Post-procedure details:    Patient tolerance of procedure:  Tolerated well, no immediate complications   (including critical care time)  Medications Ordered in ED Medications  lidocaine (XYLOCAINE) 2 % injection (has no administration in time range)  ibuprofen (ADVIL,MOTRIN) tablet 800 mg (has no administration in time range)  sulfamethoxazole-trimethoprim (BACTRIM DS,SEPTRA DS) 800-160 MG per tablet 1 tablet (has no administration in time range)     Initial Impression / Assessment and Plan / ED Course  I have reviewed the  triage vital signs and the nursing notes.  Pertinent labs & imaging results that were available during my care of the patient were reviewed by me and considered in my medical decision making (see chart for details).     Patient presenting with abscess partially draining.  No signs or symptoms of systemic infection.  I&D performed at bedside.  Sent home with 5-day course of Bactrim.  Return precautions given.  Final Clinical Impressions(s) / ED Diagnoses   Final diagnoses:  Abscess of buttock, left    ED Discharge Orders         Ordered    sulfamethoxazole-trimethoprim (BACTRIM DS,SEPTRA DS) 800-160 MG tablet  2 times daily     05/30/18 2255           Garnette Gunner, MD 05/30/18 2308    Jacalyn Lefevre, MD 05/30/18 2330

## 2018-05-30 NOTE — ED Triage Notes (Signed)
Pt c/o abscess to the buttocks for a few days.

## 2018-10-10 IMAGING — DX DG CHEST 2V
2 series · 2 of 2 positions shown · non-contrast
Comparison: Chest x-ray dated January 21, 2005.

CLINICAL DATA: Left-sided chest pain for the past 2 days.

EXAM:
CHEST  2 VIEW

[chest pa]
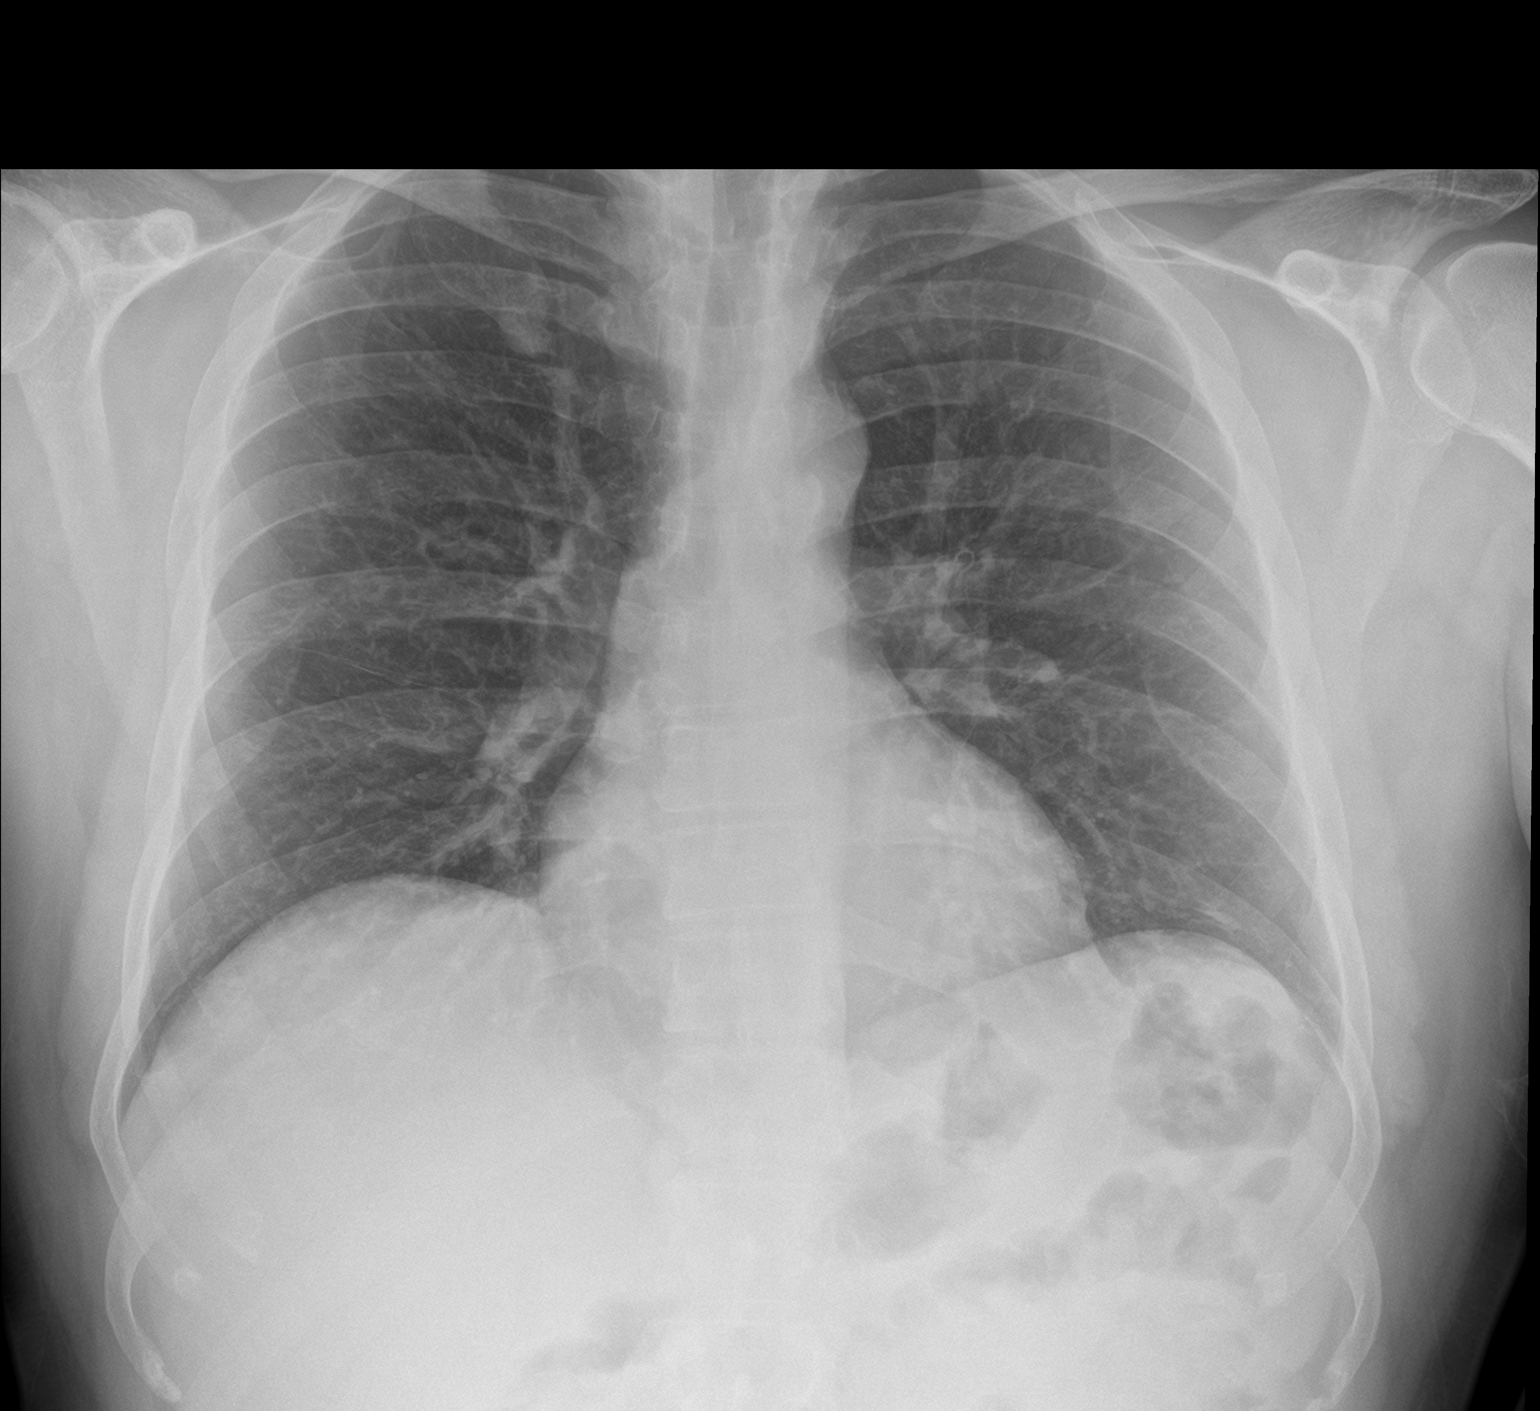

[chest lat]
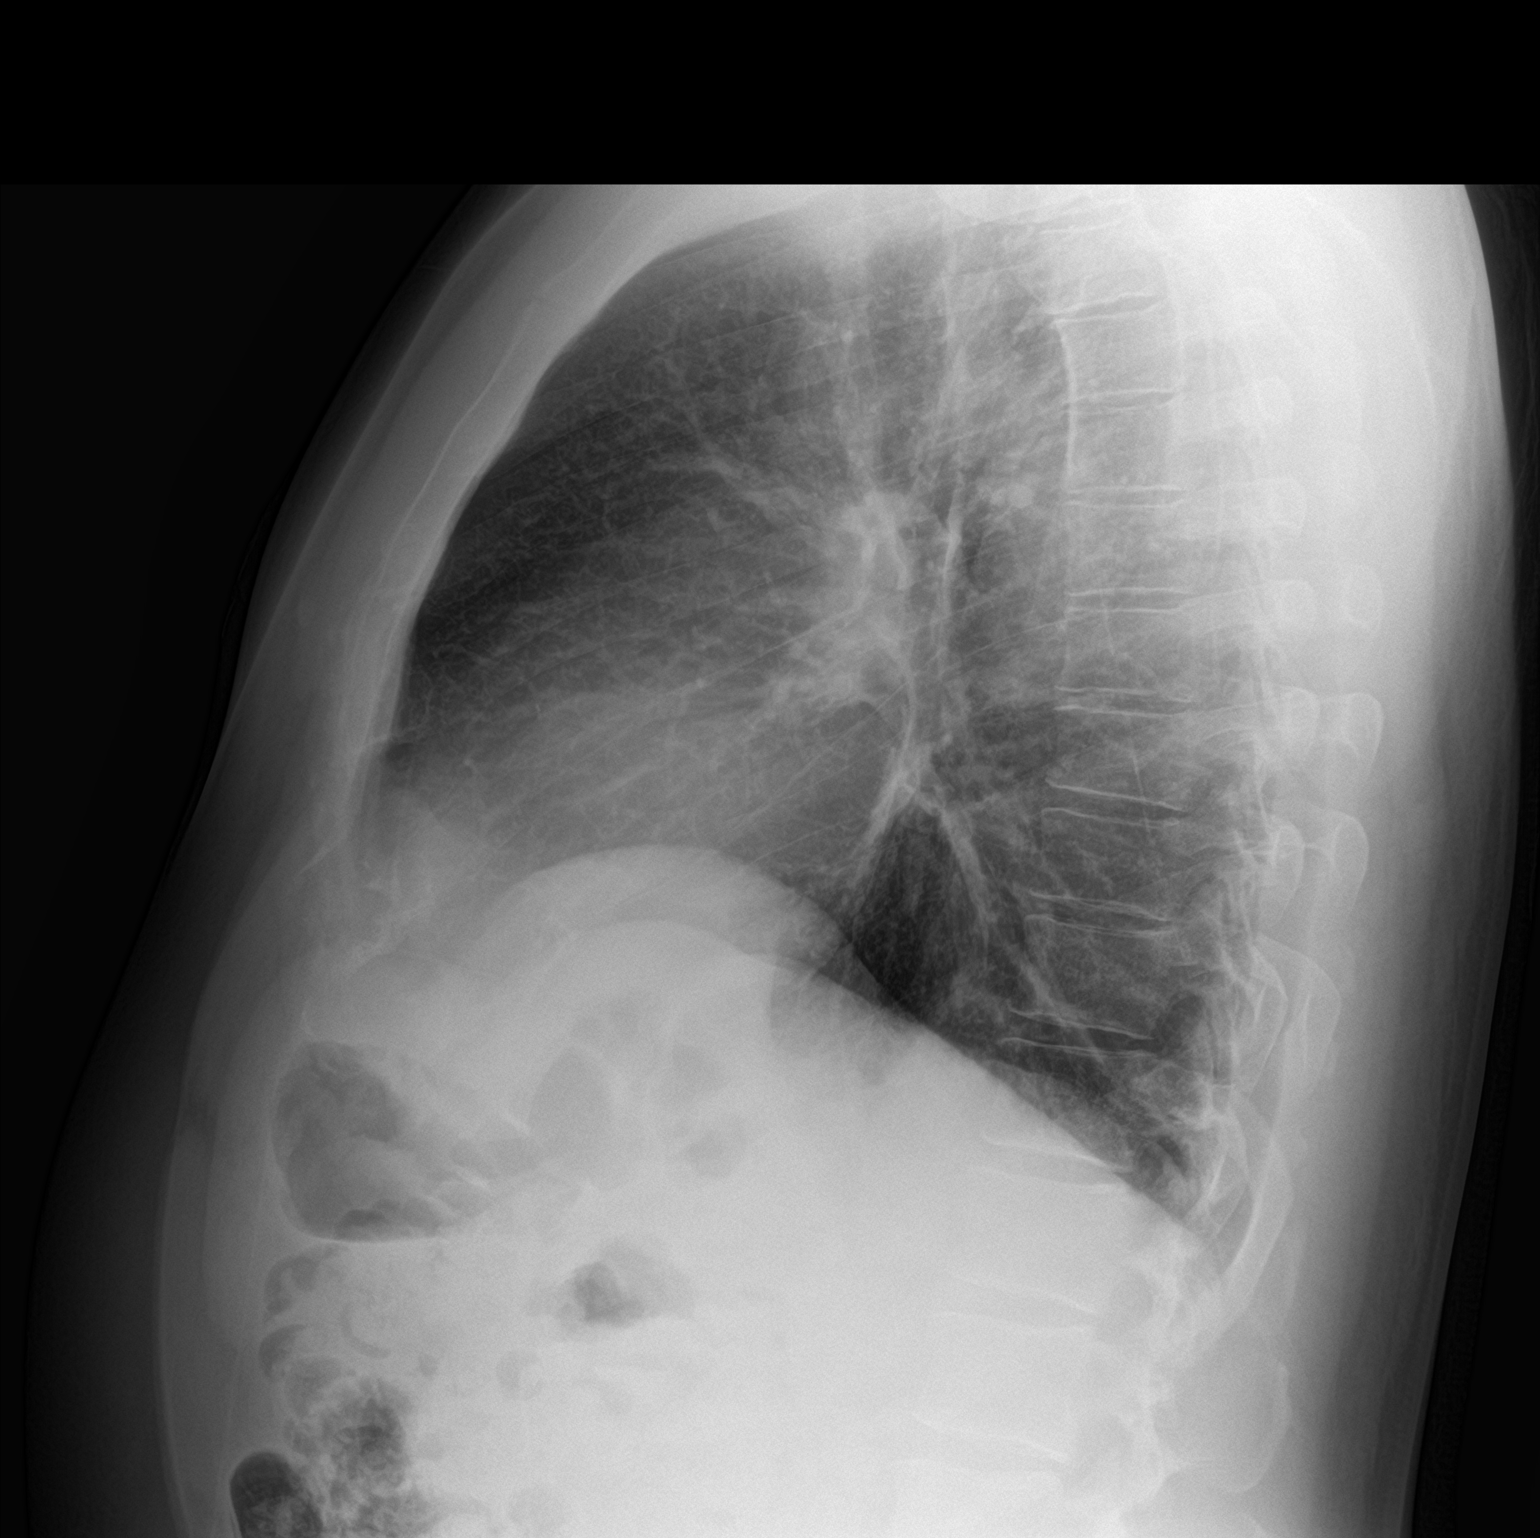

[2 of 2 positions shown; findings below may reference images not displayed]

FINDINGS: The heart size and mediastinal contours are within normal limits.
Both lungs are clear. The visualized skeletal structures are
unremarkable.
IMPRESSION: No active cardiopulmonary disease.

## 2023-07-09 ENCOUNTER — Other Ambulatory Visit: Payer: Self-pay

## 2023-07-09 ENCOUNTER — Emergency Department (HOSPITAL_COMMUNITY)
Admission: EM | Admit: 2023-07-09 | Discharge: 2023-07-09 | Disposition: A | Discharge status: Home / Self Care | Payer: BLUE CROSS/BLUE SHIELD | Attending: Emergency Medicine | Admitting: Emergency Medicine

## 2023-07-09 ENCOUNTER — Encounter (HOSPITAL_COMMUNITY): Payer: Self-pay | Admitting: Emergency Medicine

## 2023-07-09 DIAGNOSIS — R519 Headache, unspecified: Secondary | ICD-10-CM | POA: Diagnosis present

## 2023-07-09 MED ORDER — BUPIVACAINE HCL (PF) 0.25 % IJ SOLN: 10.0000 mL | Freq: Once | INTRAMUSCULAR | Status: DC

## 2023-07-09 MED ORDER — KETOROLAC TROMETHAMINE 30 MG/ML IJ SOLN
30.0000 mg | Freq: Once | INTRAMUSCULAR | Status: AC
  Administered 2023-07-09: 30 mg via INTRAMUSCULAR
  Filled 2023-07-09: qty 1

## 2023-07-09 MED ORDER — BUPIVACAINE HCL (PF) 0.5 % IJ SOLN
10.0000 mL | Freq: Once | INTRAMUSCULAR | Status: AC
  Administered 2023-07-09: 10 mL
  Filled 2023-07-09: qty 30

## 2023-07-09 MED ORDER — DIAZEPAM 5 MG PO TABS
5.0000 mg | ORAL_TABLET | Freq: Once | ORAL | Status: AC
  Administered 2023-07-09: 5 mg via ORAL
  Filled 2023-07-09: qty 1

## 2023-07-09 NOTE — ED Notes (Signed)
Patient verbalizes understanding of discharge instructions. Opportunity for questioning and answers were provided. Armband removed by staff, pt discharged from ED. Ambulated out to lobby  

## 2023-07-09 NOTE — Discharge Instructions (Signed)
You were seen today for a posterior headache.  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  Continue ibuprofen as needed.  Follow-up with neurology given your recurrence of symptoms.

## 2023-07-09 NOTE — ED Triage Notes (Signed)
Pt in with pain to R occipital head, ongoing x 1 wk. Pt states he first noticed it when he sneezed one night, and this brought on sudden throbbing HA. Ibuprofen and Excedrin are no longer helping. Tender to touch

## 2023-07-09 NOTE — ED Provider Notes (Signed)
[place] EMERGENCY DEPARTMENT AT [facility] Provider Note   CSN: [id number] Arrival date & time: 07/09/23  0429     History  Chief Complaint  Patient presents with   Headache    Samuel Daniels is a 48 y.o. male.  HPI     This a 61 male who presents with headache.  Patient reports 1 week of occipital headache.  Onset of symptoms on Tuesday.  He states he got up and he sneezed hard and had acute onset of very localized pain to the right occipital region.  Pain has been there ever since.  He has taken Excedrin and ibuprofen.  He does get some relief with ibuprofen.  However, he has had some difficulty sleeping.  He states he had a similar episode several years ago where he was evaluated and had an injection that was very helpful.  Denies any strokelike symptoms, vision changes.  Chart reviewed.  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  Improved with a nerve block.  Home Medications Prior to Admission medications   Not on File      Allergies    Amoxicillin and Penicillins    Review of Systems   Review of Systems  Constitutional:  Negative for fever.  Eyes:  Negative for visual disturbance.  Neurological:  Positive for headaches. Negative for weakness and numbness.  All other systems reviewed and are negative.   Physical Exam Updated Vital Signs BP (!) 142/79   Pulse 64   Temp 98.1 F (36.7 C) (Oral)   Resp 18   Wt 108.9 kg   SpO2 96%   BMI 34.44 kg/m  Physical Exam Vitals and nursing note reviewed.  Constitutional:      Appearance: He is well-developed. He is not ill-appearing.  HENT:     Head: Normocephalic and atraumatic.     Comments: Point tenderness right occipital region Eyes:     Pupils: Pupils are equal, round, and reactive to light.  Cardiovascular:     Rate and Rhythm: Normal rate and regular rhythm.  Pulmonary:     Effort: Pulmonary effort is normal. No respiratory distress.  Abdominal:      Palpations: Abdomen is soft.     Tenderness: There is no rebound.  Musculoskeletal:     Cervical back: Neck supple.  Lymphadenopathy:     Cervical: No cervical adenopathy.  Skin:    General: Skin is warm and dry.  Neurological:     Mental Status: He is alert and oriented to person, place, and time.     Comments: Cranial nerves II through XII intact, 5 out of 5 strength in all 4 extremities, no dysmetria to finger-nose-finger     ED Results / Procedures / Treatments   Labs (all labs ordered are listed, but only abnormal results are displayed) Labs Reviewed - No data to display  EKG None  Radiology No results found.  Procedures .Nerve Block  Date/Time: 07/09/2023 6:40 AM  Performed by: Shon Baton, MD Authorized by: Shon Baton, MD   Consent:    Consent obtained:  Verbal   Consent given by:  Patient   Risks discussed:  Allergic reaction, infection, nerve damage, swelling, pain and unsuccessful block   Alternatives discussed:  No treatment Universal protocol:    Patient identity confirmed:  Verbally with patient Indications:    Indications:  Pain relief Location:    Body area:  Head   Head nerve:  Greater occipital  Laterality:  Right Pre-procedure details:    Skin preparation:  Povidone-iodine Skin anesthesia:    Skin anesthesia method:  None Procedure details:    Block needle gauge:  27 G   Anesthetic injected:  Bupivacaine 0.5% w/o epi   Steroid injected:  None   Additive injected:  None   Injection procedure:  Anatomic landmarks identified, incremental injection and introduced needle Post-procedure details:    Dressing:  None   Outcome:  Anesthesia achieved   Procedure completion:  Tolerated     Medications Ordered in ED Medications  ketorolac (TORADOL) 30 MG/ML injection 30 mg (30 mg Intramuscular Given 07/09/23 0508)  diazepam (VALIUM) tablet 5 mg (5 mg Oral Given 07/09/23 0508)  bupivacaine(PF) (MARCAINE) 0.5 % injection 10 mL (10 mLs  Infiltration Given 07/09/23 1610)    ED Course/ Medical Decision Making/ A&P                                 Medical Decision Making Risk Prescription drug management.   This patient presents to the ED for concern of headache, this involves an extensive number of treatment options, and is a complaint that carries with it a high risk of complications and morbidity.  I considered the following differential and admission for this acute, potentially life threatening condition.  The differential diagnosis includes migraine, tension headache, occipital neuralgia, occipital muscle spasm  MDM:    This is a 48 year old male who presents with headache.  He is nontoxic and vital signs are reassuring.  He is neurologically intact.  No red flags.  Low suspicion for intracranial injury.  History and physical exam are most suggestive of occipital spasm or occipital neuralgia.  He has a history of the same.  Initially given Toradol and Valium with minimal relief.  Ultimately occipital nerve block was performed and he had complete resolution of symptoms.  Will refer to neurology given that this is his second episode with similar symptoms.  CT imaging deferred as I have low risk for intracranial abnormality.  (Labs, imaging, consults)  Labs: I Ordered, and personally interpreted labs.  The pertinent results include: None  Imaging Studies ordered: I ordered imaging studies including none I independently visualized and interpreted imaging. I agree with the radiologist interpretation  Additional history obtained from chart review.  External records from outside source obtained and reviewed including prior evaluations  Cardiac Monitoring: The patient was not maintained on a cardiac monitor.  If on the cardiac monitor, I personally viewed and interpreted the cardiac monitored which showed an underlying rhythm of: N/A  Reevaluation: After the interventions noted above, I reevaluated the patient and found  that they have :resolved  Social Determinants of Health:  lives independently  Disposition: Discharge  Co morbidities that complicate the patient evaluation History reviewed. No pertinent past medical history.   Medicines Meds ordered this encounter  Medications   ketorolac (TORADOL) 30 MG/ML injection 30 mg   diazepam (VALIUM) tablet 5 mg   DISCONTD: bupivacaine (PF) (MARCAINE) 0.25 % injection 10 mL   bupivacaine(PF) (MARCAINE) 0.5 % injection 10 mL    I have reviewed the patients home medicines and have made adjustments as needed  Problem List / ED Course: Problem List Items Addressed This Visit   None Visit Diagnoses       Recurrent occipital headache    -  Primary   Relevant Medications   ketorolac (TORADOL) 30 MG/ML injection 30 mg (  Completed)   bupivacaine(PF) (MARCAINE) 0.5 % injection 10 mL (Completed)                   Final Clinical Impression(s) / ED Diagnoses Final diagnoses:  Recurrent occipital headache    Rx / DC Orders ED Discharge Orders     None         Shon Baton, MD 07/09/23 [phone]

## 2023-07-16 ENCOUNTER — Other Ambulatory Visit (HOSPITAL_COMMUNITY): Payer: BLUE CROSS/BLUE SHIELD

## 2023-07-16 ENCOUNTER — Observation Stay (HOSPITAL_COMMUNITY)
Admission: EM | Admit: 2023-07-16 | Discharge: 2023-07-16 | Disposition: A | Discharge status: Home / Self Care | Payer: BLUE CROSS/BLUE SHIELD | Attending: Family Medicine | Admitting: Family Medicine

## 2023-07-16 ENCOUNTER — Emergency Department (HOSPITAL_COMMUNITY): Payer: BLUE CROSS/BLUE SHIELD

## 2023-07-16 ENCOUNTER — Observation Stay (HOSPITAL_COMMUNITY): Payer: BLUE CROSS/BLUE SHIELD

## 2023-07-16 ENCOUNTER — Other Ambulatory Visit: Payer: Self-pay

## 2023-07-16 ENCOUNTER — Encounter (HOSPITAL_COMMUNITY): Payer: Self-pay

## 2023-07-16 DIAGNOSIS — E66812 Obesity, class 2: Secondary | ICD-10-CM | POA: Insufficient documentation

## 2023-07-16 DIAGNOSIS — Z7902 Long term (current) use of antithrombotics/antiplatelets: Secondary | ICD-10-CM | POA: Insufficient documentation

## 2023-07-16 DIAGNOSIS — Z7982 Long term (current) use of aspirin: Secondary | ICD-10-CM | POA: Diagnosis not present

## 2023-07-16 DIAGNOSIS — R519 Headache, unspecified: Secondary | ICD-10-CM | POA: Diagnosis present

## 2023-07-16 DIAGNOSIS — F1090 Alcohol use, unspecified, uncomplicated: Secondary | ICD-10-CM | POA: Insufficient documentation

## 2023-07-16 DIAGNOSIS — M542 Cervicalgia: Secondary | ICD-10-CM | POA: Diagnosis not present

## 2023-07-16 DIAGNOSIS — Z87891 Personal history of nicotine dependence: Secondary | ICD-10-CM | POA: Insufficient documentation

## 2023-07-16 DIAGNOSIS — I7774 Dissection of vertebral artery: Secondary | ICD-10-CM

## 2023-07-16 DIAGNOSIS — M5412 Radiculopathy, cervical region: Secondary | ICD-10-CM | POA: Diagnosis not present

## 2023-07-16 DIAGNOSIS — E669 Obesity, unspecified: Secondary | ICD-10-CM | POA: Insufficient documentation

## 2023-07-16 DIAGNOSIS — I6501 Occlusion and stenosis of right vertebral artery: Secondary | ICD-10-CM | POA: Diagnosis not present

## 2023-07-16 DIAGNOSIS — Z6834 Body mass index (BMI) 34.0-34.9, adult: Secondary | ICD-10-CM | POA: Insufficient documentation

## 2023-07-16 LAB — LIPID PANEL
Cholesterol: 172 mg/dL (ref 0–200)
HDL: 50 mg/dL (ref >40)
LDL Cholesterol: 89 mg/dL (ref 0–99)
Total CHOL/HDL Ratio: 3.4 {ratio}
Triglycerides: 164 mg/dL — ABNORMAL HIGH (ref <150)
VLDL: 33 mg/dL (ref 0–40)

## 2023-07-16 LAB — CBC WITH DIFFERENTIAL/PLATELET
Abs Immature Granulocytes: 0.04 10*3/uL (ref 0.00–0.07)
Basophils Absolute: 0.1 10*3/uL (ref 0.0–0.1)
Basophils Relative: 1 %
Eosinophils Absolute: 0 10*3/uL (ref 0.0–0.5)
Eosinophils Relative: 0 %
HCT: 44.7 % (ref 39.0–52.0)
Hemoglobin: 15.2 g/dL (ref 13.0–17.0)
Immature Granulocytes: 0 %
Lymphocytes Relative: 13 %
Lymphs Abs: 1.4 10*3/uL (ref 0.7–4.0)
MCH: 30.2 pg (ref 26.0–34.0)
MCHC: 34 g/dL (ref 30.0–36.0)
MCV: 88.7 fL (ref 80.0–100.0)
Monocytes Absolute: 0.9 10*3/uL (ref 0.1–1.0)
Monocytes Relative: 8 %
Neutro Abs: 8.8 10*3/uL — ABNORMAL HIGH (ref 1.7–7.7)
Neutrophils Relative %: 78 %
Platelets: 255 10*3/uL (ref 150–400)
RBC: 5.04 MIL/uL (ref 4.22–5.81)
RDW: 12.8 % (ref 11.5–15.5)
WBC: 11.2 10*3/uL — ABNORMAL HIGH (ref 4.0–10.5)
nRBC: 0 % (ref 0.0–0.2)

## 2023-07-16 LAB — BASIC METABOLIC PANEL
Anion gap: 8 (ref 5–15)
BUN: 17 mg/dL (ref 6–20)
CO2: 27 mmol/L (ref 22–32)
Calcium: 9.4 mg/dL (ref 8.9–10.3)
Chloride: 104 mmol/L (ref 98–111)
Creatinine, Ser: 1.05 mg/dL (ref 0.61–1.24)
GFR, Estimated: >60 mL/min (ref >60)
Glucose, Bld: 109 mg/dL — ABNORMAL HIGH (ref 70–99)
Potassium: 3.8 mmol/L (ref 3.5–5.1)
Sodium: 139 mmol/L (ref 135–145)

## 2023-07-16 LAB — HEMOGLOBIN A1C
Hgb A1c MFr Bld: 5.3 % (ref 4.8–5.6)
Mean Plasma Glucose: 105.41 mg/dL

## 2023-07-16 LAB — CK: Total CK: 133 U/L (ref 49–397)

## 2023-07-16 LAB — C-REACTIVE PROTEIN: CRP: 0.6 mg/dL (ref <1.0)

## 2023-07-16 MED ORDER — IOHEXOL 350 MG/ML SOLN
75.0000 mL | Freq: Once | INTRAVENOUS | Status: AC | PRN
  Administered 2023-07-16: 75 mL via INTRAVENOUS

## 2023-07-16 MED ORDER — HYDROMORPHONE HCL 1 MG/ML IJ SOLN
INTRAMUSCULAR | Status: AC
  Administered 2023-07-16: 1 mg via INTRAVENOUS
  Filled 2023-07-16: qty 1

## 2023-07-16 MED ORDER — ACETAMINOPHEN 325 MG PO TABS
650.0000 mg | ORAL_TABLET | Freq: Four times a day (QID) | ORAL | Status: DC | PRN
Start: 2023-07-16 — End: 2023-07-16

## 2023-07-16 MED ORDER — LORAZEPAM 2 MG/ML IJ SOLN
INTRAMUSCULAR | Status: AC
  Administered 2023-07-16: 1 mg via INTRAVENOUS
  Filled 2023-07-16: qty 1

## 2023-07-16 MED ORDER — PROCHLORPERAZINE EDISYLATE 10 MG/2ML IJ SOLN
5.0000 mg | Freq: Once | INTRAMUSCULAR | Status: AC
  Administered 2023-07-16: 5 mg via INTRAVENOUS
  Filled 2023-07-16: qty 2

## 2023-07-16 MED ORDER — DIPHENHYDRAMINE HCL 50 MG/ML IJ SOLN
25.0000 mg | Freq: Once | INTRAMUSCULAR | Status: AC
  Administered 2023-07-16: 25 mg via INTRAVENOUS
  Filled 2023-07-16: qty 1

## 2023-07-16 MED ORDER — LORAZEPAM 2 MG/ML IJ SOLN: 1.0000 mg | Freq: Two times a day (BID) | INTRAMUSCULAR | Status: DC | PRN

## 2023-07-16 MED ORDER — ATORVASTATIN CALCIUM 10 MG PO TABS: 10.0000 mg | ORAL_TABLET | Freq: Every day | ORAL | 11 refills | Status: AC

## 2023-07-16 MED ORDER — ONDANSETRON HCL 4 MG PO TABS: 4.0000 mg | ORAL_TABLET | Freq: Four times a day (QID) | ORAL | Status: DC | PRN

## 2023-07-16 MED ORDER — ASPIRIN 81 MG PO TBEC: 81.0000 mg | DELAYED_RELEASE_TABLET | Freq: Every day | ORAL | 5 refills | Status: AC

## 2023-07-16 MED ORDER — ASPIRIN 81 MG PO TBEC
81.0000 mg | DELAYED_RELEASE_TABLET | Freq: Once | ORAL | Status: AC
  Administered 2023-07-16: 81 mg via ORAL
  Filled 2023-07-16: qty 1

## 2023-07-16 MED ORDER — ACETAMINOPHEN 650 MG RE SUPP: 650.0000 mg | Freq: Four times a day (QID) | RECTAL | Status: DC | PRN

## 2023-07-16 MED ORDER — SODIUM CHLORIDE 0.9 % IV BOLUS
1000.0000 mL | Freq: Once | INTRAVENOUS | Status: AC
  Administered 2023-07-16: 1000 mL via INTRAVENOUS

## 2023-07-16 MED ORDER — HYDROMORPHONE HCL 1 MG/ML IJ SOLN: 1.0000 mg | Freq: Once | INTRAMUSCULAR | Status: AC

## 2023-07-16 MED ORDER — ACETAMINOPHEN 325 MG PO TABS: 650.0000 mg | ORAL_TABLET | Freq: Four times a day (QID) | ORAL | Status: AC | PRN

## 2023-07-16 MED ORDER — ACETAMINOPHEN 500 MG PO TABS
1000.0000 mg | ORAL_TABLET | Freq: Once | ORAL | Status: AC
  Administered 2023-07-16: 1000 mg via ORAL
  Filled 2023-07-16: qty 2

## 2023-07-16 MED ORDER — CLOPIDOGREL BISULFATE 75 MG PO TABS
75.0000 mg | ORAL_TABLET | Freq: Once | ORAL | Status: AC
  Administered 2023-07-16: 75 mg via ORAL
  Filled 2023-07-16: qty 1

## 2023-07-16 MED ORDER — LIDOCAINE 5 % EX PTCH
1.0000 | MEDICATED_PATCH | Freq: Once | CUTANEOUS | Status: AC
  Administered 2023-07-16: 1 via TRANSDERMAL
  Filled 2023-07-16: qty 1

## 2023-07-16 MED ORDER — ONDANSETRON HCL 4 MG/2ML IJ SOLN: 4.0000 mg | Freq: Four times a day (QID) | INTRAMUSCULAR | Status: DC | PRN

## 2023-07-16 MED ORDER — CLOPIDOGREL BISULFATE 75 MG PO TABS: 75.0000 mg | ORAL_TABLET | Freq: Every day | ORAL | 5 refills | Status: AC

## 2023-07-16 NOTE — H&P (Signed)
History and Physical    Patient: Samuel Daniels RUE:[id number] DOB: 01-02-1976 DOA: 07/16/2023 DOS: the patient was seen and examined on 07/16/2023 PCP: Benita Stabile, MD  Patient coming from: Home  Chief Complaint:  Chief Complaint  Patient presents with   Headache   HPI: Samuel Daniels is a 48 y.o. male with medical history of occipital neuralgia who presents to the emergency department with complaint of headache.  Patient complained of a right-sided headache which started on waking up (1 hour PTA), the headache was right-sided and was associated with right-sided numbness and weakness.  Patient noted leaning to the right side when he tries to walk, he also complained of having cold sweats as well as severe pain to the back of his right neck.  There was no vision changes, aphasia or dysarthria.  Symptoms self resolved after about 30 minutes, he continued to have headache, though this also has improved. Patient states that he went to his chiropractor on Friday (1/24) and had manipulation of C-spine.  He was seen for similar presentation on 1/19, occipital nerve block was done and symptoms resolved at that time.  ED Course:  In the emergency department, BP was 140/88, other vital signs were within normal range.  Workup in the ED showed normal CBC except for WBC of 11.2.  BMP was normal except for blood glucose of 1 9.  Total CK 133. CT angiography head and neck with and without contrast showed occlusion of the right vertebral artery at the V3 segment/skull base, likely reflecting an acute dissection. Neurologist (Dr. Otelia Limes) was consulted and recommended that patient can stay here at [facility].  Further stroke workup was recommended.  Review of Systems: Review of systems as noted in the HPI. All other systems reviewed and are negative.   History reviewed. No pertinent past medical history. Past Surgical History:  Procedure Laterality Date   APPENDECTOMY      Social History:  reports that he has  quit smoking. His smoking use included cigarettes and cigars. He has never used smokeless tobacco. He reports current alcohol use. He reports that he does not use drugs.   Allergies  Allergen Reactions   Amoxicillin Rash   Penicillins Rash    Has patient had a PCN reaction causing immediate rash, facial/tongue/throat swelling, SOB or lightheadedness with hypotension: Unknown Has patient had a PCN reaction causing severe rash involving mucus membranes or skin necrosis: Unknown Has patient had a PCN reaction that required hospitalization: Unknown Has patient had a PCN reaction occurring within the last 10 years: No If all of the above answers are "NO", then may proceed with Cephalosporin use.     History reviewed. No pertinent family history.    Prior to Admission medications   Not on File    Physical Exam: BP (!) 149/101   Pulse 71   Resp 20   Ht 5\' 10"  (1.778 m)   Wt 108.9 kg   SpO2 96%   BMI 34.44 kg/m   General: 48 y.o. year-old male well developed well nourished in no acute distress.  Alert and oriented x3. HEENT: NCAT, EOMI, lidocaine patch noted at the back of right side of neck Neck: Supple, trachea medial Cardiovascular: Regular rate and rhythm with no rubs or gallops.  No thyromegaly or JVD noted.  No lower extremity edema. 2/4 pulses in all 4 extremities. Respiratory: Clear to auscultation with no wheezes or rales. Good inspiratory effort. Abdomen: Soft, nontender nondistended with normal bowel sounds x4 quadrants.  Muskuloskeletal: No cyanosis, clubbing or edema noted bilaterally Neuro: CN II-XII intact, strength 5/5 x 4, sensation, reflexes intact Skin: No ulcerative lesions noted or rashes Psychiatry: Judgement and insight appear normal. Mood is appropriate for condition and setting          Labs on Admission:  Basic Metabolic Panel: Recent Labs  Lab 07/16/23 0205  NA 139  K 3.8  CL 104  CO2 27  GLUCOSE 109*  BUN 17  CREATININE 1.05  CALCIUM 9.4    Liver Function Tests: No results for input(s): "AST", "ALT", "ALKPHOS", "BILITOT", "PROT", "ALBUMIN" in the last 168 hours. No results for input(s): "LIPASE", "AMYLASE" in the last 168 hours. No results for input(s): "AMMONIA" in the last 168 hours. CBC: Recent Labs  Lab 07/16/23 0205  WBC 11.2*  NEUTROABS 8.8*  HGB 15.2  HCT 44.7  MCV 88.7  PLT 255   Cardiac Enzymes: Recent Labs  Lab 07/16/23 0205  CKTOTAL 133    BNP (last 3 results) No results for input(s): "BNP" in the last 8760 hours.  ProBNP (last 3 results) No results for input(s): "PROBNP" in the last 8760 hours.  CBG: No results for input(s): "GLUCAP" in the last 168 hours.  Radiological Exams on Admission: CT ANGIO HEAD NECK W WO CM Result Date: 07/16/2023 CLINICAL DATA:  Initial evaluation for acute headache, neck pain, unilateral weakness, recent chiropractic manipulation. EXAM: CT ANGIOGRAPHY HEAD AND NECK WITH AND WITHOUT CONTRAST TECHNIQUE: Multidetector CT imaging of the head and neck was performed using the standard protocol during bolus administration of intravenous contrast. Multiplanar CT image reconstructions and MIPs were obtained to evaluate the vascular anatomy. Carotid stenosis measurements (when applicable) are obtained utilizing NASCET criteria, using the distal internal carotid diameter as the denominator. RADIATION DOSE REDUCTION: This exam was performed according to the departmental dose-optimization program which includes automated exposure control, adjustment of the mA and/or kV according to patient size and/or use of iterative reconstruction technique. CONTRAST:  75mL OMNIPAQUE IOHEXOL 350 MG/ML SOLN COMPARISON:  CT from 05/06/2015 FINDINGS: CT HEAD FINDINGS Brain: Examination mildly degraded by motion artifact. Cerebral volume within normal limits. No acute intracranial hemorrhage. No acute large vessel territory infarct. No mass lesion or midline shift. No hydrocephalus or extra-axial fluid  collection. Vascular: No abnormal hyperdense vessel. Skull: Scalp soft tissues demonstrate no acute finding. Calvarium intact. Sinuses/Orbits: Globes orbital soft tissues within normal limits. Paranasal sinuses and mastoid air cells are clear. Other: None. Review of the MIP images confirms the above findings CTA NECK FINDINGS Aortic arch: Standard branching. Imaged portion shows no evidence of aneurysm or dissection. No significant stenosis of the major arch vessel origins. Right carotid system: No evidence of dissection, stenosis (50% or greater), or occlusion. Left carotid system: No evidence of dissection, stenosis (50% or greater), or occlusion. Vertebral arteries: Both vertebral arteries arise from the subclavian arteries. Left vertebral artery dominant and widely patent without stenosis or dissection. Right vertebral artery patent proximally. Distal right vertebral artery becomes increasingly attenuated at the V3 segment and occludes at the skull base, likely reflecting an acute dissection (series 13, images 97, 98). No visible raised dissection flap. Skeleton: No discrete or worrisome osseous lesions. Other neck: No other acute finding. Upper chest: No other acute finding. Review of the MIP images confirms the above findings CTA HEAD FINDINGS Anterior circulation: Both internal carotid arteries are patent to the termini without stenosis. A1 segments patent bilaterally. Normal anterior communicating artery complex. Anterior cerebral arteries patent without stenosis. No M1  stenosis or occlusion. Distal MCA branches perfused and symmetric. Posterior circulation: Left V4 segment widely patent to the vertebrobasilar junction without stenosis. Left PICA patent. Right vertebral artery occluded at the skull base. Slight retrograde filling of the distal right V4 segment across the vertebrobasilar junction. Right PICA not seen. Basilar patent without stenosis. Superior cerebral arteries patent bilaterally. Both PCAs  primarily supplied via the basilar well perfused to their distal aspects. Venous sinuses: Patent allowing for timing the contrast bolus. Anatomic variants: As above.  No aneurysm. Review of the MIP images confirms the above findings IMPRESSION: CT HEAD: No acute intracranial abnormality. CTA HEAD AND NECK: 1. Occlusion of the right vertebral artery at the V3 segment/skull base, likely reflecting an acute dissection. 2. Otherwise negative CTA of the head and neck. No other large vessel occlusion or other emergent finding. Critical Value/emergent results were called by telephone at the time of interpretation on 07/16/2023 at 3:36 am to provider Tanda Rockers , who verbally acknowledged these results. Electronically Signed   By: Rise Mu M.D.   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 CT angiogram head and neck showed no large vessel occlusion Echocardiogram in the morning MRI of brain without contrast in the morning Continue fall precautions and neuro checks Lipid panel and hemoglobin A1c will be checked Continue PT/OT eval and treat Bedside swallow eval by nursing prior to diet Teleneurology will be consulted  Cervical neuralgia Continue lidocaine patch Continue Tylenol as needed for mild pain Continue Percocet for moderate to severe pain  Obesity (BMI 34.44) Diet and lifestyle modification    DVT prophylaxis: SCDs   Family Communication: Significant  other at bedside (all questions answered to satisfaction)   Advance Care Planning:   Code Status: Full Code   Consults: Neurology  Severity of Illness: The appropriate patient status for this patient is OBSERVATION. Observation status is judged to be reasonable and necessary in order to provide the required intensity of service to ensure the patient's safety. The patient's presenting symptoms, physical exam findings, and initial radiographic and laboratory data in the context of their medical condition is felt to place them at decreased risk for further clinical deterioration. Furthermore, it is anticipated that the patient will be medically stable for discharge from the hospital within 2 midnights of admission.   Author: Frankey Shown, DO 07/16/2023 7:34 AM  For on call review [email or web address].

## 2023-07-16 NOTE — ED Provider Notes (Signed)
[place] EMERGENCY DEPARTMENT AT [facility] Provider Note  CSN: [id number] Arrival date & time: 07/16/23 0102  Chief Complaint(s) Headache  HPI Samuel Daniels is a 48 y.o. male with past medical history as below, significant for appendectomy, occipital neuralgia, who presents to the ED with complaint of headache  Seen 1/19 with similar complaints, symptoms resolved with occipital nerve block.  He is here for the same, ongoing around 2 weeks.  Unrelieved OTC analgesia.  No vision changes, no dysphagia or dysphonia.  No recent head trauma  Reports that he woke up around an hour prior to arrival, he had right-sided headache, felt weak on his right side, had numbness on the right side.  He tried to walk to the restroom and felt he was leaning to the right.  He was diaphoretic, had severe pain to the back of his neck on the right.  No vision changes, no dysarthria or aphasia.  Symptoms lasted approximately 30 minutes and resolved spontaneously.  Still having right-sided headache but has somewhat improved.  He also reports that he was seen by chiropractor on Friday and had C-spine manipulation performed  Past Medical History History reviewed. No pertinent past medical history. 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 No pertinent family history.  Social History Social History   Tobacco Use   Smoking status: Former    Types: Cigarettes, Cigars   Smokeless tobacco: Never  Substance Use Topics   Alcohol use: Yes    Comment:  occasional   Drug use: No   Allergies Amoxicillin and Penicillins  Review of Systems Review of Systems  Constitutional:  Negative for chills and fever.  Respiratory:  Negative for chest tightness and shortness of breath.   Cardiovascular:  Negative for chest pain and palpitations.  Gastrointestinal:  Negative for abdominal pain.  Genitourinary:  Negative for dysuria.  Musculoskeletal:  Positive for neck pain.  Neurological:  Positive for weakness, light-headedness, numbness and headaches.  All other systems reviewed and are negative.   Physical Exam Vital Signs  I have reviewed the triage vital signs BP (!) 149/101   Pulse 71   Resp 20   Ht 5\' 10"  (1.778 m)   Wt 108.9 kg   SpO2 96%   BMI 34.44 kg/m  Physical Exam Vitals and nursing note reviewed.  Constitutional:      General: He is not in acute distress.  Appearance: Normal appearance. He is well-developed.  HENT:     Head: Normocephalic and atraumatic.     Right Ear: External ear normal.     Left Ear: External ear normal.     Mouth/Throat:     Mouth: Mucous membranes are moist.  Eyes:     General: No scleral icterus.    Extraocular Movements: Extraocular movements intact.     Pupils: Pupils are equal, round, and reactive to light.  Cardiovascular:     Rate and Rhythm: Normal rate and regular rhythm.     Pulses: Normal pulses.     Heart sounds: Normal heart sounds.  Pulmonary:     Effort: Pulmonary effort is normal. No respiratory distress.     Breath sounds: Normal breath sounds.  Abdominal:     General: Abdomen is flat.     Palpations: Abdomen is soft.     Tenderness: There is no abdominal tenderness.  Musculoskeletal:     Cervical back: No rigidity.     Right lower leg: No edema.     Left lower leg: No edema.  Skin:    General: Skin is warm and dry.     Capillary Refill: Capillary refill takes less than 2 seconds.  Neurological:     Mental Status: He is alert and oriented to person, place, and  time.     GCS: GCS eye subscore is 4. GCS verbal subscore is 5. GCS motor subscore is 6.     Cranial Nerves: Cranial nerves 2-12 are intact. No dysarthria or facial asymmetry.     Sensory: Sensation is intact.     Motor: Motor function is intact. No tremor or pronator drift.     Coordination: Coordination is intact. Finger-Nose-Finger Test and Heel to [place] Test normal.     Gait: Gait is intact.     Comments: Strength 5/5 to BLUE/BLLE, equal and symmetric   No dysmetria No drift    Psychiatric:        Mood and Affect: Mood normal.        Behavior: Behavior normal.     ED Results and Treatments Labs (all labs ordered are listed, but only abnormal results are displayed) Labs Reviewed  CBC WITH DIFFERENTIAL/PLATELET - Abnormal; Notable for the following components:      Result Value   WBC 11.2 (*)    Neutro Abs 8.8 (*)    All other components within normal limits  BASIC METABOLIC PANEL - Abnormal; Notable for the following components:   Glucose, Bld 109 (*)    All other components within normal limits  LIPID PANEL - Abnormal; Notable for the following components:   Triglycerides 164 (*)    All other components within normal limits  CK  C-REACTIVE PROTEIN  HEMOGLOBIN A1C  HIV ANTIBODY (ROUTINE TESTING W REFLEX)  Radiology CT ANGIO HEAD NECK W WO CM Result Date: 07/16/2023 CLINICAL DATA:  Initial evaluation for acute headache, neck pain, unilateral weakness, recent chiropractic manipulation. EXAM: CT ANGIOGRAPHY HEAD AND NECK WITH AND WITHOUT CONTRAST TECHNIQUE: Multidetector CT imaging of the head and neck was performed using the standard protocol during bolus administration of intravenous contrast. Multiplanar CT image reconstructions and MIPs were obtained to evaluate the vascular anatomy. Carotid stenosis measurements (when applicable) are obtained utilizing  NASCET criteria, using the distal internal carotid diameter as the denominator. RADIATION DOSE REDUCTION: This exam was performed according to the departmental dose-optimization program which includes automated exposure control, adjustment of the mA and/or kV according to patient size and/or use of iterative reconstruction technique. CONTRAST:  75mL OMNIPAQUE IOHEXOL 350 MG/ML SOLN COMPARISON:  CT from 05/06/2015 FINDINGS: CT HEAD FINDINGS Brain: Examination mildly degraded by motion artifact. Cerebral volume within normal limits. No acute intracranial hemorrhage. No acute large vessel territory infarct. No mass lesion or midline shift. No hydrocephalus or extra-axial fluid collection. Vascular: No abnormal hyperdense vessel. Skull: Scalp soft tissues demonstrate no acute finding. Calvarium intact. Sinuses/Orbits: Globes orbital soft tissues within normal limits. Paranasal sinuses and mastoid air cells are clear. Other: None. Review of the MIP images confirms the above findings CTA NECK FINDINGS Aortic arch: Standard branching. Imaged portion shows no evidence of aneurysm or dissection. No significant stenosis of the major arch vessel origins. Right carotid system: No evidence of dissection, stenosis (50% or greater), or occlusion. Left carotid system: No evidence of dissection, stenosis (50% or greater), or occlusion. Vertebral arteries: Both vertebral arteries arise from the subclavian arteries. Left vertebral artery dominant and widely patent without stenosis or dissection. Right vertebral artery patent proximally. Distal right vertebral artery becomes increasingly attenuated at the V3 segment and occludes at the skull base, likely reflecting an acute dissection (series 13, images 97, 98). No visible raised dissection flap. Skeleton: No discrete or worrisome osseous lesions. Other neck: No other acute finding. Upper chest: No other acute finding. Review of the MIP images confirms the above findings CTA HEAD  FINDINGS Anterior circulation: Both internal carotid arteries are patent to the termini without stenosis. A1 segments patent bilaterally. Normal anterior communicating artery complex. Anterior cerebral arteries patent without stenosis. No M1 stenosis or occlusion. Distal MCA branches perfused and symmetric. Posterior circulation: Left V4 segment widely patent to the vertebrobasilar junction without stenosis. Left PICA patent. Right vertebral artery occluded at the skull base. Slight retrograde filling of the distal right V4 segment across the vertebrobasilar junction. Right PICA not seen. Basilar patent without stenosis. Superior cerebral arteries patent bilaterally. Both PCAs primarily supplied via the basilar well perfused to their distal aspects. Venous sinuses: Patent allowing for timing the contrast bolus. Anatomic variants: As above.  No aneurysm. Review of the MIP images confirms the above findings IMPRESSION: CT HEAD: No acute intracranial abnormality. CTA HEAD AND NECK: 1. Occlusion of the right vertebral artery at the V3 segment/skull base, likely reflecting an acute dissection. 2. Otherwise negative CTA of the head and neck. No other large vessel occlusion or other emergent finding. Critical Value/emergent results were called by telephone at the time of interpretation on 07/16/2023 at 3:36 am to provider Tanda Rockers , who verbally acknowledged these results. Electronically Signed   By: Rise Mu M.D.   On: 07/16/2023 03:39    Pertinent labs & imaging results that were available during my care of the patient were reviewed by me and considered in my medical decision making (  see MDM for details).  Medications Ordered in ED Medications  lidocaine (LIDODERM) 5 % 1 patch (1 patch Transdermal Patch Applied 07/16/23 0210)  LORazepam (ATIVAN) injection 1 mg (1 mg Intravenous Given 07/16/23 0744)  acetaminophen (TYLENOL) tablet 650 mg (has no administration in time range)    Or  acetaminophen  (TYLENOL) suppository 650 mg (has no administration in time range)  ondansetron (ZOFRAN) tablet 4 mg (has no administration in time range)    Or  ondansetron (ZOFRAN) injection 4 mg (has no administration in time range)  sodium chloride 0.9 % bolus 1,000 mL (0 mLs Intravenous Stopped 07/16/23 0447)  prochlorperazine (COMPAZINE) injection 5 mg (5 mg Intravenous Given 07/16/23 0208)  diphenhydrAMINE (BENADRYL) injection 25 mg (25 mg Intravenous Given 07/16/23 0207)  acetaminophen (TYLENOL) tablet 1,000 mg (1,000 mg Oral Given 07/16/23 0205)  iohexol (OMNIPAQUE) 350 MG/ML injection 75 mL (75 mLs Intravenous Contrast Given 07/16/23 0258)  aspirin EC tablet 81 mg (81 mg Oral Given 07/16/23 0453)  clopidogrel (PLAVIX) tablet 75 mg (75 mg Oral Given 07/16/23 0453)  HYDROmorphone (DILAUDID) injection 1 mg (1 mg Intravenous Given 07/16/23 0746)                                                                                                                                     Procedures .Critical Care  Performed by: Sloan Leiter, DO Authorized by: Sloan Leiter, DO   Critical care provider statement:    Critical care time (minutes):  30   Critical care time was exclusive of:  Separately billable procedures and treating other patients   Critical care was necessary to treat or prevent imminent or life-threatening deterioration of the following conditions:  CNS failure or compromise   Critical care was time spent personally by me on the following activities:  Development of treatment plan with patient or surrogate, discussions with consultants, evaluation of patient's response to treatment, examination of patient, ordering and review of laboratory studies, ordering and review of radiographic studies, ordering and performing treatments and interventions, pulse oximetry, re-evaluation of patient's condition, review of old charts and obtaining history from patient or surrogate   Care discussed with: admitting  provider     (including critical care time)  Medical Decision Making / ED Course    Medical Decision Making:    Samuel Daniels is a 48 y.o. male with past medical history as below, significant for appendectomy, occipital neuralgia, who presents to the ED with complaint of headache. The complaint involves an extensive differential diagnosis and also carries with it a high risk of complications and morbidity.  Serious etiology was considered. Ddx includes but is not limited to:  Differential diagnosis includes but is not exclusive to subarachnoid hemorrhage, meningitis, encephalitis, previous head trauma, cavernous venous thrombosis, muscle tension headache, glaucoma, temporal arteritis, migraine or migraine equivalent, etc.   Complete initial physical exam performed, notably the patient was in no  distress, pain characterized as mild, neuroexam is nonfocal..    Reviewed and confirmed nursing documentation for past medical history, family history, social history.  Vital signs reviewed.      Clinical Course as of 07/16/23 1308  Wynelle Link Jul 16, 2023  0144 ABCD2 is 4 points [SG]  0337 Right vertebral artery occlusion w/ dissection @V3  noted on CTA; he had recent c-spine manipulation on Friday w/ chiropractor  [SG]  [phone] consult neuro [SG]  0438 Repaged neuro [SG]  [id number] Spoke w/ Dr Otelia Limes, okay to stay here, recommends MRI/echo, hypercoagulability w/u, Plavix 75mg , 81mg  asa, tele. OKAY to stay here [SG]    Clinical Course User Index [SG] Sloan Leiter, DO    Brief summary: 48 year old male with history as above here with right-sided headache, right-sided weakness, numbness, diaphoretic.  Symptoms lasted approximate 30 minutes and resolved spontaneously.  Still having right-sided headache but numbness and weakness has resolved.  NIHSS 0. Non-focal exam. Still having headache, right sided neck pain/occipital pain. Given recent c-spine manipulation w/ neuro derangement will get CTA. Check labs  and give analgesia.   Labs and imaging reviewed.  CTA concerning for right vertebral artery occlusion with possible dissection at V3.  Discussed with neurology.  Dr. Otelia Limes, see above.  Okay for patient stay here per neuro.    He is feeling better. Agreeable to plan  Admitted Dr Thomes Dinning               Additional history obtained: -Additional history obtained from na -External records from outside source obtained and reviewed including: Chart review including previous notes, labs, imaging, consultation notes including  Prior ED visits, prior imaging, prior labs   Lab Tests: -I ordered, reviewed, and interpreted labs.   The pertinent results include:   Labs Reviewed  CBC WITH DIFFERENTIAL/PLATELET - Abnormal; Notable for the following components:      Result Value   WBC 11.2 (*)    Neutro Abs 8.8 (*)    All other components within normal limits  BASIC METABOLIC PANEL - Abnormal; Notable for the following components:   Glucose, Bld 109 (*)    All other components within normal limits  LIPID PANEL - Abnormal; Notable for the following components:   Triglycerides 164 (*)    All other components within normal limits  CK  C-REACTIVE PROTEIN  HEMOGLOBIN A1C  HIV ANTIBODY (ROUTINE TESTING W REFLEX)    Notable for labs stable  EKG   EKG Interpretation Date/Time:  Sunday July 16 2023 02:12:57 EST Ventricular Rate:  63 PR Interval:  169 QRS Duration:  89 QT Interval:  395 QTC Calculation: 405 R Axis:   20  Text Interpretation: Sinus rhythm no stemi Confirmed by Tanda Rockers ([id number]) on 07/16/2023 3:09:59 AM         Imaging Studies ordered: I ordered imaging studies including CTA head and neck I independently visualized the following imaging with scope of interpretation limited to determining acute life threatening conditions related to emergency care; findings noted above I independently visualized and interpreted imaging. I agree with the radiologist  interpretation   Medicines ordered and prescription drug management: Meds ordered this encounter  Medications   sodium chloride 0.9 % bolus 1,000 mL   prochlorperazine (COMPAZINE) injection 5 mg   diphenhydrAMINE (BENADRYL) injection 25 mg   lidocaine (LIDODERM) 5 % 1 patch   acetaminophen (TYLENOL) tablet 1,000 mg   iohexol (OMNIPAQUE) 350 MG/ML injection 75 mL   aspirin EC tablet 81 mg   clopidogrel (  PLAVIX) tablet 75 mg   LORazepam (ATIVAN) injection 1 mg   HYDROmorphone (DILAUDID) injection 1 mg   OR Linked Order Group    acetaminophen (TYLENOL) tablet 650 mg    acetaminophen (TYLENOL) suppository 650 mg   OR Linked Order Group    ondansetron (ZOFRAN) tablet 4 mg    ondansetron (ZOFRAN) injection 4 mg   LORazepam (ATIVAN) 2 MG/ML injection    Ether Griffins, Deanna N: cabinet override   HYDROmorphone (DILAUDID) 1 MG/ML injection    Ether Griffins, Deanna N: cabinet override    -I have reviewed the patients home medicines and have made adjustments as needed   Consultations Obtained: I requested consultation with the neuro dr Macario Golds,  and discussed lab and imaging findings as well as pertinent plan - they recommend: as above, admit   Cardiac Monitoring: The patient was maintained on a cardiac monitor.  I personally viewed and interpreted the cardiac monitored which showed an underlying rhythm of: NSR Continuous pulse oximetry interpreted by myself, 100% on RA.    Social Determinants of Health:  Diagnosis or treatment significantly limited by social determinants of health: former smoker   Reevaluation: After the interventions noted above, I reevaluated the patient and found that they have improved  Co morbidities that complicate the patient evaluation History reviewed. No pertinent past medical history.    Dispostion: Disposition decision including need for hospitalization was considered, and patient admitted to the hospital.    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

## 2023-07-16 NOTE — ED Notes (Signed)
ED Provider at bedside.

## 2023-07-16 NOTE — ED Triage Notes (Signed)
Pt bib EMS from home with c/o headache to back of head with intermittent numbness to right side of head. This started last week on 1/19 when pt was seen here for the same.

## 2023-07-16 NOTE — Progress Notes (Addendum)
On-call neurology note  Called by Dr. 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  MRI brain with no evidence of acute stroke.  Both the CT angiography and MRI confirmed the dissection.  He has a history of traumatic neck manipulation by chiropractor which is the likely etiology of this dissection.  He has no focal neurological deficits.  He has headache which is better.    In the absence of a stroke or TIA, he does not need stroke/risk factor workup.  For the vertebral artery dissection, he needs to be on dual antiplatelets for 6 months, this duration may be changed based on how he does clinically and what his imaging shows at that time in terms of the dissection's healing.  I recommend that he follow-up with [facility] neurology, Dr. Pearlean Brownie in the next 8 to 12 weeks and they consider an outpatient CT angiography of the head and neck to evaluate this dissection further.  -- Milon Dikes, MD Neurologist [facility]  Routine inpatient neurology services are not available at this facility over the weekend.  This recommendation was provided after history was obtained from the provider and chart and imaging was reviewed.  No charge note

## 2023-07-16 NOTE — Discharge Summary (Signed)
Samuel Daniels, is a 48 y.o. male  DOB [birth year]  MRN [id number].  Admission date:  07/16/2023  Admitting Physician  Frankey Shown, DO  Discharge Date:  07/16/2023   Primary MD  Benita Stabile, MD  Recommendations for primary care physician for things to follow:  1)Please take Aspirin 81 mg daily along with Plavix 75 mg daily for 180 days then after that STOP the Plavix  and continue ONLY Aspirin 81 mg daily indefinitely--for secondary stroke Prevention   2)Avoid ibuprofen/Advil/Aleve/Motrin/Goody Powders/Naproxen/BC powders/Meloxicam/Diclofenac/Indomethacin and other Nonsteroidal anti-inflammatory medications as these will make you more likely to bleed and can cause stomach ulcers, can also cause Kidney problems.   3)Outpatient follow-up with Stroke Neurologist Dr. Pearlean Brownie- in 3 Months--   4)You Need to Cut way back on Alcohol intake---avoid drinking more than 1 to 2 drinks per week  5)Follow up with Stroke Neurologist Dr. 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 No pertinent past medical history.  Past Surgical History:  Procedure Laterality Date   APPENDECTOMY       HPI  from the history and physical done on the day of admission:   HPI: Samuel Daniels is a 48 y.o. male with medical history of occipital neuralgia who presents to the emergency department with complaint of headache.  Patient complained of a right-sided headache which started on waking up (1 hour PTA), the headache was right-sided and was associated with right-sided numbness and weakness.  Patient  noted leaning to the right side when he tries to walk, he also complained of having cold sweats as well as severe pain to the back of his right neck.  There was no vision changes, aphasia or dysarthria.  Symptoms self resolved after about 30 minutes, he continued to have headache, though this also has improved. Patient states that he went to his chiropractor on Friday (1/24) and had manipulation of C-spine.   He was seen for similar presentation on 1/19, occipital nerve block was done and symptoms resolved at that time.   ED Course:  In the emergency department, BP was 140/88, other vital signs were within normal range.  Workup in the ED showed normal CBC except for WBC of 11.2.  BMP was normal except for blood glucose of 1 9.  Total CK 133. CT angiography head and neck with and without contrast showed occlusion of the right vertebral artery at the V3 segment/skull base, likely reflecting an acute dissection. Neurologist (Dr. Otelia Limes) was consulted and recommended that patient can stay here at [facility].  Further stroke workup was recommended.   Review of Systems: Review of systems as noted in the HPI. All other systems reviewed and are negative.  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 Wilford Corner -Both the CT angiography and MRI confirmed the acute dissection of right vertebral artery--per neurologist this is likely related to a traumatic neck manipulation by chiropractor recently --CT angiography head and neck with and without contrast showed occlusion of the right vertebral artery at the V3 segment/skull base, likely reflecting an acute dissection. -MRI brain without contrast shows dissected and Occluded distal right vertebral artery as demonstrated by CTA,  No associated posterior circulation infarct. No acute intracranial abnormality. CT angiogram head and neck showed  no large vessel occlusion LDL is 89 HDL is 50 triglycerides 164 -A1c is 5.3 Discussed with Neurologist Dr. Marland Mcalpine aspirin and Plavix combo for at least 6 months and outpatient follow-up with stroke neurologist Dr. Pearlean Brownie in about 3 months   2)Cervical neuralgia Continue lidocaine patch Continue Tylenol as needed for mild pain -Avoid NSAIDs in the setting of DAPT use   3)Class 2 Obesity- -Low calorie diet, portion control and increase physical activity discussed with patient -Body mass index is 34.44 kg/m.   4) alcohol use--patient drinks 2-3 drinks daily -Moderation of alcohol advised  Discharge Condition: stable  Follow UP   Follow-up Information     Micki Riley, MD. Schedule an appointment as soon as possible for a visit in 3 month(s).   Specialties: Neurology, Radiology Contact information: [address] [place] [address] [place] [address] [phone]         Benita Stabile, MD. Schedule an appointment as soon as possible for a visit in 1 week(s).   Specialty: Internal Medicine Contact information: [address] [place] [address] [phone]                 Consults obtained - Neurology  Diet and Activity recommendation:  As advised  Discharge Instructions    Discharge Instructions     Ambulatory referral to Neurology   Complete by: As directed    An appointment is requested in approximately: follow up in 12 weeks---for Recheck and Repeat CTA Head and Neck   Ambulatory referral to Physical Therapy   Complete by: As directed    Neck pain   Iontophoresis - 4 mg/ml of dexamethasone: Yes   T.E.N.S. Unit Evaluation and Dispense as Indicated: Yes   Call MD for:  difficulty breathing, headache or visual disturbances   Complete by: As directed    Call MD for:  persistant dizziness or light-headedness   Complete by: As directed    Call MD for:  temperature >100.4   Complete by: As directed    Diet - low sodium heart healthy    Complete by: As directed    Discharge instructions   Complete by: As directed    1)Please take Aspirin 81 mg daily along with Plavix 75 mg daily for 180 days then after that STOP the Plavix  and continue ONLY Aspirin 81 mg daily indefinitely--for secondary stroke Prevention   2)Avoid ibuprofen/Advil/Aleve/Motrin/Goody Powders/Naproxen/BC powders/Meloxicam/Diclofenac/Indomethacin and other Nonsteroidal anti-inflammatory medications as these will make you more likely to bleed and can cause stomach ulcers, can also cause Kidney problems.   3)Outpatient follow-up with Stroke Neurologist Dr. Pearlean Brownie- in 3 Months--   4)You Need to Cut way back on Alcohol intake---avoid drinking more than 1 to 2 drinks per week  5)Follow up with Stroke Neurologist Dr. Pearlean Brownie ---Phone Number----[phone] [facility] [phone]  [place] [address]   Increase activity slowly   Complete by: As directed        Discharge Medications     Allergies as of 07/16/2023       Reactions   Amoxicillin Rash   Penicillins Rash   Has patient had a PCN reaction causing immediate rash, facial/tongue/throat swelling, SOB or lightheadedness with hypotension: Unknown Has patient had a PCN reaction causing severe rash involving mucus membranes or skin necrosis: Unknown Has patient had a PCN reaction that required hospitalization: Unknown Has patient had a PCN reaction occurring within the last 10 years: No If all of the above answers are "NO", then may proceed with Cephalosporin use.        Medication List     TAKE these medications    acetaminophen 325 MG tablet Commonly known as: TYLENOL Take 2 tablets (650 mg total) by mouth every 6 (six) hours as needed for mild pain (pain score 1-3) (or Fever >/= 101).   aspirin EC 81 MG tablet Take 1 tablet (81 mg total) by mouth daily with breakfast. Along with Plavix 75 mg daily   atorvastatin 10 MG tablet Commonly known as: Lipitor Take 1 tablet (10  mg total) by mouth daily.   clopidogrel 75 MG tablet Commonly known as: Plavix Take 1 tablet (75 mg total) by mouth daily. Along with Aspirin 81 mg daily       Major procedures and Radiology Reports - PLEASE review detailed and final reports for all details, in brief -   MR BRAIN WO CONTRAST Result Date: 07/16/2023 CLINICAL DATA:  48 year old male with headache, neck pain, weakness, recent chiropractor treatment. Occluded right vertebral artery V3 segment. EXAM: MRI HEAD WITHOUT CONTRAST TECHNIQUE: Multiplanar, multiecho pulse sequences of the brain and surrounding structures were obtained without intravenous contrast. COMPARISON:  CT head and CTA head and neck 0303 hours today. FINDINGS: Brain: Normal cerebral volume. No restricted diffusion to suggest acute infarction. No midline shift, mass effect, evidence of mass lesion, ventriculomegaly, extra-axial collection or acute intracranial hemorrhage. Cervicomedullary junction and pituitary are within normal limits. No cerebral edema identified in the posterior fossa or posterior circulation. Mild for age nonspecific anterior circulation white matter T2 and FLAIR hyperintensity (series 12, image 20). No cortical encephalomalacia. No chronic cerebral blood products. Vascular: Major intracranial vascular flow voids loss of the distal right vertebral artery flow void, including in the V4 segment (series 11, images 6 and 8). Associated conspicuous abnormal susceptibility in the vessel on SWI series 14, image 11). And abnormal diffusion and intrinsic T1 hyperintensity in the V4 segment compatible with thrombus, dissection (series 13, image 14). Other Major intracranial vascular flow voids are preserved. Skull and upper cervical spine: Negative. Sinuses/Orbits: Negative. Paranasal sinuses and mastoids are stable and well aerated. Other: Negative. IMPRESSION: 1. Dissected and Occluded distal right vertebral artery as demonstrated by CTA. 2. No associated  posterior circulation infarct. No acute intracranial abnormality. Electronically Signed   By: Odessa Fleming M.D.   On: 07/16/2023 08:48   CT ANGIO HEAD NECK W WO CM Result Date: 07/16/2023 CLINICAL DATA:  Initial evaluation for acute headache, neck pain, unilateral weakness, recent chiropractic manipulation. EXAM: CT ANGIOGRAPHY HEAD AND NECK WITH AND WITHOUT CONTRAST TECHNIQUE: Multidetector CT imaging of the head and neck was performed using the standard protocol during bolus administration of intravenous contrast. Multiplanar CT image reconstructions and MIPs were obtained to evaluate the vascular anatomy. Carotid stenosis measurements (when applicable) are obtained utilizing NASCET criteria, using the  distal internal carotid diameter as the denominator. RADIATION DOSE REDUCTION: This exam was performed according to the departmental dose-optimization program which includes automated exposure control, adjustment of the mA and/or kV according to patient size and/or use of iterative reconstruction technique. CONTRAST:  75mL OMNIPAQUE IOHEXOL 350 MG/ML SOLN COMPARISON:  CT from 05/06/2015 FINDINGS: CT HEAD FINDINGS Brain: Examination mildly degraded by motion artifact. Cerebral volume within normal limits. No acute intracranial hemorrhage. No acute large vessel territory infarct. No mass lesion or midline shift. No hydrocephalus or extra-axial fluid collection. Vascular: No abnormal hyperdense vessel. Skull: Scalp soft tissues demonstrate no acute finding. Calvarium intact. Sinuses/Orbits: Globes orbital soft tissues within normal limits. Paranasal sinuses and mastoid air cells are clear. Other: None. Review of the MIP images confirms the above findings CTA NECK FINDINGS Aortic arch: Standard branching. Imaged portion shows no evidence of aneurysm or dissection. No significant stenosis of the major arch vessel origins. Right carotid system: No evidence of dissection, stenosis (50% or greater), or occlusion. Left carotid  system: No evidence of dissection, stenosis (50% or greater), or occlusion. Vertebral arteries: Both vertebral arteries arise from the subclavian arteries. Left vertebral artery dominant and widely patent without stenosis or dissection. Right vertebral artery patent proximally. Distal right vertebral artery becomes increasingly attenuated at the V3 segment and occludes at the skull base, likely reflecting an acute dissection (series 13, images 97, 98). No visible raised dissection flap. Skeleton: No discrete or worrisome osseous lesions. Other neck: No other acute finding. Upper chest: No other acute finding. Review of the MIP images confirms the above findings CTA HEAD FINDINGS Anterior circulation: Both internal carotid arteries are patent to the termini without stenosis. A1 segments patent bilaterally. Normal anterior communicating artery complex. Anterior cerebral arteries patent without stenosis. No M1 stenosis or occlusion. Distal MCA branches perfused and symmetric. Posterior circulation: Left V4 segment widely patent to the vertebrobasilar junction without stenosis. Left PICA patent. Right vertebral artery occluded at the skull base. Slight retrograde filling of the distal right V4 segment across the vertebrobasilar junction. Right PICA not seen. Basilar patent without stenosis. Superior cerebral arteries patent bilaterally. Both PCAs primarily supplied via the basilar well perfused to their distal aspects. Venous sinuses: Patent allowing for timing the contrast bolus. Anatomic variants: As above.  No aneurysm. Review of the MIP images confirms the above findings IMPRESSION: CT HEAD: No acute intracranial abnormality. CTA HEAD AND NECK: 1. Occlusion of the right vertebral artery at the V3 segment/skull base, likely reflecting an acute dissection. 2. Otherwise negative CTA of the head and neck. No other large vessel occlusion or other emergent finding. Critical Value/emergent results were called by telephone  at the time of interpretation on 07/16/2023 at 3:36 am to provider Tanda Rockers , who verbally acknowledged these results. Electronically Signed   By: Rise Mu M.D.   On: 07/16/2023 03:39   Today   Subjective    Samuel Daniels today has no new concerns -As per patient right-sided stroke symptoms have resolved -Occipital headache is improved -Plan of care discussed with patient and girlfriend at bedside, questions answered        Patient has been seen and examined prior to discharge   Objective   Blood pressure (!) 131/90, pulse 63, temperature 97.9 F (36.6 C), temperature source Oral, resp. rate 11, height 5\' 10"  (1.778 m), weight 108.9 kg, SpO2 94%.  No intake or output data in the 24 hours ending 07/16/23 1033  Exam Gen:- Awake Alert, no acute distress  HEENT:- [place].AT, No sclera icterus Neck-Supple Neck,No JVD,.  Lungs-  CTAB , good air movement bilaterally CV- S1, S2 normal, regular Abd-  +ve B.Sounds, Abd Soft, No tenderness, increased truncal adiposity Extremity/Skin:- No  edema,   good pulses Psych-affect is appropriate, oriented x3 Neuro-no new focal deficits, no tremors    Data Review   CBC w Diff:  Lab Results  Component Value Date   WBC 11.2 (H) 07/16/2023   HGB 15.2 07/16/2023   HCT 44.7 07/16/2023   PLT 255 07/16/2023   LYMPHOPCT 13 07/16/2023   MONOPCT 8 07/16/2023   EOSPCT 0 07/16/2023   BASOPCT 1 07/16/2023   CMP:  Lab Results  Component Value Date   NA 139 07/16/2023   K 3.8 07/16/2023   CL 104 07/16/2023   CO2 27 07/16/2023   BUN 17 07/16/2023   CREATININE 1.05 07/16/2023   Total Discharge time is about 33 minutes  Shon Hale M.D on 07/16/2023 at 10:33 AM  Go to [email or web address] -  for contact info  [facility] - Office  [phone]

## 2023-07-16 NOTE — Evaluation (Signed)
Physical Therapy Evaluation Patient Details Name: Samuel Daniels MRN: [id number] DOB: 12/31/1975 Today's Date: 07/16/2023  History of Present Illness  Samuel Daniels is a 48 y.o. male with medical history of occipital neuralgia who presents to the emergency department with complaint of headache.  Patient complained of a right-sided headache which started on waking up (1 hour PTA), the headache was right-sided and was associated with right-sided numbness and weakness.  Patient noted leaning to the right side when he tries to walk, he also complained of having cold sweats as well as severe pain to the back of his right neck.  There was no vision changes, aphasia or dysarthria.  Symptoms self resolved after about 30 minutes, he continued to have headache, though this also has improved.  Patient states that he went to his chiropractor on Friday (1/24) and had manipulation of C-spine.     He was seen for similar presentation on 1/19, occipital nerve block was done and symptoms resolved at that time.    Clinical Impression  Patient lying in bed on therapist arrival.  Girlfriend at bedside.  PT starts asking patient questions regarding medical history.  Patient denies pain currently.  Technician arrives to transport patient to MRI.  Patient able to sit up on edge of bed with modified independence; taking extra time to perform.  Demonstrates good sitting balance on the edge of the bed.  He is able to perform sit to stand and take a couple steps over to the chair with CGA for safety.  Patient left with technician to go have MRI.  Did not get to finish asking him about his home set up; namely his bathroom accessibility.  We will keep patient on caseload during his hospitalization to monitor for any changes in his functional status.        If plan is discharge home, recommend the following: A little help with walking and/or transfers;A little help with bathing/dressing/bathroom   Can travel by private vehicle         Equipment Recommendations None recommended by PT  Recommendations for Other Services       Functional Status Assessment Patient has had a recent decline in their functional status and demonstrates the ability to make significant improvements in function in a reasonable and predictable amount of time.     Precautions / Restrictions Precautions Precautions: None Restrictions Weight Bearing Restrictions Per Provider Order: No      Mobility  Bed Mobility Overal bed mobility: Modified Independent             General bed mobility comments: takes extra time    Transfers Overall transfer level: Needs assistance Equipment used: None Transfers: Sit to/from Stand, Bed to chair/wheelchair/BSC Sit to Stand: Contact guard assist   Step pivot transfers: Contact guard assist       General transfer comment: no unsteadiness noted    Ambulation/Gait Ambulation/Gait assistance: Contact guard assist Gait Distance (Feet): 2 Feet Assistive device: None   Gait velocity: decreased        Stairs            Wheelchair Mobility     Tilt Bed    Modified Rankin (Stroke Patients Only)       Balance Overall balance assessment: Needs assistance Sitting-balance support: No upper extremity supported, Feet supported Sitting balance-Leahy Scale: Good     Standing balance support: No upper extremity supported, During functional activity Standing balance-Leahy Scale: Good Standing balance comment: CGA for safety  Pertinent Vitals/Pain Pain Assessment Pain Assessment: 0-10 Pain Score: 0-No pain Pain Location: headache (resolved with medication)    Home Living Family/patient expects to be discharged to:: Private residence Living Arrangements: Alone Available Help at Discharge: Friend(s);Available 24 hours/day Type of Home: House Home Access: Stairs to enter Entrance Stairs-Rails: Right;Left;Can reach both Entrance  Stairs-Number of Steps: 5   Home Layout: One level        Prior Function Prior Level of Function : Independent/Modified Independent;Working/employed;Driving                     Extremity/Trunk Assessment   Upper Extremity Assessment Upper Extremity Assessment: Defer to OT evaluation    Lower Extremity Assessment Lower Extremity Assessment: Overall WFL for tasks assessed    Cervical / Trunk Assessment Cervical / Trunk Assessment: Normal  Communication   Communication Communication: No apparent difficulties  Cognition Arousal: Alert Behavior During Therapy: WFL for tasks assessed/performed Overall Cognitive Status: Within Functional Limits for tasks assessed                                          General Comments      Exercises     Assessment/Plan    PT Assessment Patient needs continued PT services  PT Problem List Decreased activity tolerance;Decreased balance;Decreased mobility       PT Treatment Interventions Gait training;Therapeutic activities;Therapeutic exercise;Balance training    PT Goals (Current goals can be found in the Care Plan section)  Acute Rehab PT Goals Patient Stated Goal: return home PT Goal Formulation: With patient/family Time For Goal Achievement: 07/30/23    Frequency Min 2X/week     Co-evaluation               AM-PAC PT "6 Clicks" Mobility  Outcome Measure Help needed turning from your back to your side while in a flat bed without using bedrails?: None Help needed moving from lying on your back to sitting on the side of a flat bed without using bedrails?: None Help needed moving to and from a bed to a chair (including a wheelchair)?: A Little Help needed standing up from a chair using your arms (e.g., wheelchair or bedside chair)?: A Little Help needed to walk in hospital room?: A Little Help needed climbing 3-5 steps with a railing? : A Little 6 Click Score: 20    End of Session   Activity  Tolerance: Patient tolerated treatment well Patient left: in chair (going out to have MRI) Nurse Communication: Mobility status PT Visit Diagnosis: Unsteadiness on feet (R26.81);Other abnormalities of gait and mobility (R26.89)    Time: 0810-0825 PT Time Calculation (min) (ACUTE ONLY): 15 min   Charges:   PT Evaluation $PT Eval Low Complexity: 1 Low   PT General Charges $$ ACUTE PT VISIT: 1 Visit         9:19 AM, 07/16/23 Glynn Freas Small Quentyn Kolbeck MPT  physical therapy [place] [phone] [phone]

## 2023-07-16 NOTE — Discharge Instructions (Signed)
1)Please take Aspirin 81 mg daily along with Plavix 75 mg daily for 180 days then after that STOP the Plavix  and continue ONLY Aspirin 81 mg daily indefinitely--for secondary stroke Prevention   2)Avoid ibuprofen/Advil/Aleve/Motrin/Goody Powders/Naproxen/BC powders/Meloxicam/Diclofenac/Indomethacin and other Nonsteroidal anti-inflammatory medications as these will make you more likely to bleed and can cause stomach ulcers, can also cause Kidney problems.   3)Outpatient follow-up with Stroke Neurologist Dr. Pearlean Brownie- in 3 Months--   4)You Need to Cut way back on Alcohol intake---avoid drinking more than 1 to 2 drinks per week  5)Follow up with Stroke Neurologist Dr. Pearlean Brownie ---Phone Number----[phone] [facility] [address] [place] [address]
